# Patient Record
Sex: Male | Born: 1950 | Hispanic: No | Marital: Married | Smoking: Never smoker
Health system: Southern US, Community
[De-identification: ages and names within clinical notes are randomized; demographics above are authoritative.]

## PROBLEM LIST (undated history)

## (undated) DIAGNOSIS — I10 Essential (primary) hypertension: Secondary | ICD-10-CM

## (undated) DIAGNOSIS — M1990 Unspecified osteoarthritis, unspecified site: Secondary | ICD-10-CM

## (undated) DIAGNOSIS — I619 Nontraumatic intracerebral hemorrhage, unspecified: Secondary | ICD-10-CM

## (undated) DIAGNOSIS — F32A Depression, unspecified: Secondary | ICD-10-CM

## (undated) DIAGNOSIS — T7840XA Allergy, unspecified, initial encounter: Secondary | ICD-10-CM

## (undated) DIAGNOSIS — E785 Hyperlipidemia, unspecified: Secondary | ICD-10-CM

## (undated) DIAGNOSIS — A159 Respiratory tuberculosis unspecified: Secondary | ICD-10-CM

## (undated) DIAGNOSIS — M109 Gout, unspecified: Secondary | ICD-10-CM

## (undated) DIAGNOSIS — N529 Male erectile dysfunction, unspecified: Secondary | ICD-10-CM

## (undated) DIAGNOSIS — F329 Major depressive disorder, single episode, unspecified: Secondary | ICD-10-CM

## (undated) DIAGNOSIS — I639 Cerebral infarction, unspecified: Secondary | ICD-10-CM

## (undated) HISTORY — PX: COLONOSCOPY: SHX174

## (undated) HISTORY — DX: Cerebral infarction, unspecified: I63.9

## (undated) HISTORY — PX: SMALL INTESTINE SURGERY: SHX150

## (undated) HISTORY — DX: Major depressive disorder, single episode, unspecified: F32.9

## (undated) HISTORY — DX: Essential (primary) hypertension: I10

## (undated) HISTORY — DX: Unspecified osteoarthritis, unspecified site: M19.90

## (undated) HISTORY — DX: Respiratory tuberculosis unspecified: A15.9

## (undated) HISTORY — DX: Depression, unspecified: F32.A

## (undated) HISTORY — DX: Allergy, unspecified, initial encounter: T78.40XA

---

## 1999-04-12 ENCOUNTER — Encounter: Admission: RE | Admit: 1999-04-12 | Discharge: 1999-07-11 | Payer: Self-pay | Admitting: Family Medicine

## 2001-12-23 ENCOUNTER — Encounter (INDEPENDENT_AMBULATORY_CARE_PROVIDER_SITE_OTHER): Payer: Self-pay | Admitting: *Deleted

## 2001-12-23 ENCOUNTER — Ambulatory Visit (HOSPITAL_COMMUNITY): Admission: RE | Admit: 2001-12-23 | Discharge: 2001-12-23 | Payer: Self-pay | Admitting: *Deleted

## 2009-04-25 ENCOUNTER — Emergency Department (HOSPITAL_COMMUNITY): Admission: EM | Admit: 2009-04-25 | Discharge: 2009-04-25 | Payer: Self-pay | Admitting: Family Medicine

## 2010-06-15 DIAGNOSIS — I619 Nontraumatic intracerebral hemorrhage, unspecified: Secondary | ICD-10-CM

## 2010-06-15 HISTORY — DX: Nontraumatic intracerebral hemorrhage, unspecified: I61.9

## 2010-06-16 ENCOUNTER — Inpatient Hospital Stay (HOSPITAL_COMMUNITY): Admission: EM | Admit: 2010-06-16 | Discharge: 2010-06-17 | Payer: Self-pay | Admitting: Emergency Medicine

## 2010-06-18 ENCOUNTER — Emergency Department (HOSPITAL_COMMUNITY): Admission: EM | Admit: 2010-06-18 | Discharge: 2010-06-18 | Payer: Self-pay | Admitting: Emergency Medicine

## 2010-11-28 ENCOUNTER — Emergency Department (HOSPITAL_COMMUNITY)
Admission: EM | Admit: 2010-11-28 | Discharge: 2010-11-29 | Payer: Self-pay | Source: Home / Self Care | Admitting: Emergency Medicine

## 2011-03-03 LAB — CBC
HCT: 43.2 % (ref 39.0–52.0)
HCT: 45.8 % (ref 39.0–52.0)
Hemoglobin: 14.8 g/dL (ref 13.0–17.0)
MCH: 30.3 pg (ref 26.0–34.0)
MCHC: 34.3 g/dL (ref 30.0–36.0)
MCHC: 34.3 g/dL (ref 30.0–36.0)
MCHC: 35.3 g/dL (ref 30.0–36.0)
MCV: 87.4 fL (ref 78.0–100.0)
MCV: 88.5 fL (ref 78.0–100.0)
Platelets: 194 10*3/uL (ref 150–400)
Platelets: 204 10*3/uL (ref 150–400)
RBC: 4.88 MIL/uL (ref 4.22–5.81)
RDW: 13.6 % (ref 11.5–15.5)
RDW: 13.6 % (ref 11.5–15.5)
WBC: 7.9 10*3/uL (ref 4.0–10.5)

## 2011-03-03 LAB — DIFFERENTIAL
Basophils Absolute: 0 10*3/uL (ref 0.0–0.1)
Basophils Relative: 0 % (ref 0–1)
Basophils Relative: 1 % (ref 0–1)
Eosinophils Absolute: 0.2 10*3/uL (ref 0.0–0.7)
Eosinophils Absolute: 0.3 10*3/uL (ref 0.0–0.7)
Lymphocytes Relative: 23 % (ref 12–46)
Lymphocytes Relative: 27 % (ref 12–46)
Lymphs Abs: 1.8 10*3/uL (ref 0.7–4.0)
Lymphs Abs: 2.2 10*3/uL (ref 0.7–4.0)
Monocytes Absolute: 0.4 10*3/uL (ref 0.1–1.0)
Neutro Abs: 5 10*3/uL (ref 1.7–7.7)
Neutrophils Relative %: 63 % (ref 43–77)

## 2011-03-03 LAB — LIPID PANEL
Cholesterol: 226 mg/dL — ABNORMAL HIGH (ref 0–200)
HDL: 50 mg/dL (ref >39)
LDL Cholesterol: 107 mg/dL — ABNORMAL HIGH (ref 0–99)
Total CHOL/HDL Ratio: 4.5 RATIO
Triglycerides: 346 mg/dL — ABNORMAL HIGH (ref <150)
VLDL: 69 mg/dL — ABNORMAL HIGH (ref 0–40)

## 2011-03-03 LAB — BASIC METABOLIC PANEL
CO2: 28 mEq/L (ref 19–32)
Calcium: 9.5 mg/dL (ref 8.4–10.5)
Creatinine, Ser: 0.82 mg/dL (ref 0.4–1.5)
GFR calc Af Amer: >60 mL/min (ref >60)
GFR calc Af Amer: >60 mL/min (ref >60)
Glucose, Bld: 112 mg/dL — ABNORMAL HIGH (ref 70–99)
Glucose, Bld: 118 mg/dL — ABNORMAL HIGH (ref 70–99)
Potassium: 4.2 mEq/L (ref 3.5–5.1)
Sodium: 140 mEq/L (ref 135–145)

## 2011-03-03 LAB — CARDIAC PANEL(CRET KIN+CKTOT+MB+TROPI)
CK, MB: 11.2 ng/mL (ref 0.3–4.0)
Relative Index: 1.7 (ref 0.0–2.5)
Total CK: 653 U/L — ABNORMAL HIGH (ref 7–232)
Troponin I: 0.03 ng/mL (ref 0.00–0.06)

## 2011-03-03 LAB — COMPREHENSIVE METABOLIC PANEL
ALT: 38 U/L (ref 0–53)
AST: 42 U/L — ABNORMAL HIGH (ref 0–37)
Albumin: 4.2 g/dL (ref 3.5–5.2)
Alkaline Phosphatase: 46 U/L (ref 39–117)
BUN: 13 mg/dL (ref 6–23)
CO2: 29 mEq/L (ref 19–32)
Calcium: 9.7 mg/dL (ref 8.4–10.5)
Chloride: 104 mEq/L (ref 96–112)
Creatinine, Ser: 0.83 mg/dL (ref 0.4–1.5)
GFR calc Af Amer: >60 mL/min (ref >60)
GFR calc non Af Amer: >60 mL/min (ref >60)
Glucose, Bld: 104 mg/dL — ABNORMAL HIGH (ref 70–99)
Potassium: 3.4 mEq/L — ABNORMAL LOW (ref 3.5–5.1)
Sodium: 140 mEq/L (ref 135–145)
Total Bilirubin: 0.7 mg/dL (ref 0.3–1.2)
Total Protein: 7.6 g/dL (ref 6.0–8.3)

## 2011-03-03 LAB — PROTIME-INR
INR: 0.91 (ref 0.00–1.49)
Prothrombin Time: 12.2 seconds (ref 11.6–15.2)

## 2011-03-03 LAB — POCT CARDIAC MARKERS
CKMB, poc: 3.6 ng/mL (ref 1.0–8.0)
Myoglobin, poc: 128 ng/mL (ref 12–200)
Myoglobin, poc: 133 ng/mL (ref 12–200)

## 2011-03-03 LAB — TSH: TSH: 1.603 u[IU]/mL (ref 0.350–4.500)

## 2011-03-03 LAB — CK TOTAL AND CKMB (NOT AT ARMC)
CK, MB: 14.6 ng/mL (ref 0.3–4.0)
Total CK: 835 U/L — ABNORMAL HIGH (ref 7–232)

## 2011-03-03 LAB — HEMOGLOBIN A1C
Hgb A1c MFr Bld: 5.8 % — ABNORMAL HIGH (ref <5.7)
Mean Plasma Glucose: 120 mg/dL — ABNORMAL HIGH (ref <117)

## 2011-05-03 NOTE — Procedures (Signed)
[place]  Patient:    Joseph [name], Joseph [name] Visit Number: [id number] MRN: [id number]          Service Type: END Location: ENDO Attending Physician:  Sabino Gasser Dictated by:   Sabino Gasser, M.D. Proc. Date: 12/23/01 Admit Date:  12/23/2001   CC:         Robert Bellow, M.D.   Procedure Report  PROCEDURE PERFORMED:  Colonoscopy with biopsy.  ENDOSCOPIST:  Sabino Gasser, M.D.  INDICATIONS FOR PROCEDURE:  Polyp.  Heme positivity.  ANESTHESIA:  Demerol 100 mg.  Versed 10 mg.  DESCRIPTION OF PROCEDURE:  With the patient mildly sedated in the left lateral decubitus position and subsequently rolled to his back and then to the right lateral decubitus position, a rectal examination was performed.  The Olympus videoscopic colonoscope was inserted in the rectum and passed through a very tortuous difficult colon all the way to the cecum.  Pressure had to be applied to the abdomen in multiple places and it was a very long tortuous colon.  We reached the cecum, the prep was good.  The cecum was identified by the ileocecal valve and appendiceal orifice, both of which were photographed. From this point, the colonoscope was slowly withdrawn, taking circumferential views of the entire colonic mucosa stopping only approximately 25 cm form the anal verge at which point two small polyps were seen.  One was photographed and both were removed using hot biopsy forceps technique, setting of 20/20 blended current.  We then withdrew the colonoscope to the rectum, which appeared normal on direct and retroflex view.  The endoscope was straightened and withdrawn.  Patients vital signs and pulse oximeter remained stable.  The patient tolerated the procedure well and without apparent complications.  FINDINGS:  Colon polyps, otherwise unremarkable.  PLAN:  Await biopsy report.  The patient will call me for results and follow up with me as an outpatient. Dictated by:   Sabino Gasser, M.D. Attending Physician:  Sabino Gasser DD:  12/23/01 TD:  12/23/01 Job: [id number]

## 2011-11-22 ENCOUNTER — Ambulatory Visit: Payer: Self-pay

## 2011-11-22 DIAGNOSIS — Z23 Encounter for immunization: Secondary | ICD-10-CM

## 2011-11-22 DIAGNOSIS — I10 Essential (primary) hypertension: Secondary | ICD-10-CM

## 2011-11-22 DIAGNOSIS — M25579 Pain in unspecified ankle and joints of unspecified foot: Secondary | ICD-10-CM

## 2011-11-22 DIAGNOSIS — Z79899 Other long term (current) drug therapy: Secondary | ICD-10-CM

## 2012-05-22 ENCOUNTER — Other Ambulatory Visit: Payer: Self-pay | Admitting: Physician Assistant

## 2012-06-04 ENCOUNTER — Other Ambulatory Visit: Payer: Self-pay | Admitting: Physician Assistant

## 2012-06-04 NOTE — Telephone Encounter (Signed)
Need chart

## 2012-06-15 ENCOUNTER — Ambulatory Visit: Payer: Self-pay | Admitting: Internal Medicine

## 2012-06-15 VITALS — BP 131/79 | HR 62 | Temp 98.5°F | Resp 16 | Ht 68.5 in | Wt 197.6 lb

## 2012-06-15 DIAGNOSIS — I10 Essential (primary) hypertension: Secondary | ICD-10-CM

## 2012-06-15 DIAGNOSIS — N529 Male erectile dysfunction, unspecified: Secondary | ICD-10-CM

## 2012-06-15 MED ORDER — LISINOPRIL 40 MG PO TABS: 40.0000 mg | ORAL_TABLET | Freq: Every day | ORAL | Status: DC

## 2012-06-15 MED ORDER — HYDROCHLOROTHIAZIDE 25 MG PO TABS: 25.0000 mg | ORAL_TABLET | Freq: Every day | ORAL | Status: DC

## 2012-06-15 MED ORDER — SILDENAFIL CITRATE 100 MG PO TABS: 50.0000 mg | ORAL_TABLET | Freq: Every day | ORAL | Status: DC | PRN

## 2012-06-15 NOTE — Patient Instructions (Signed)
STOP THE AMLODIPINE AND STOP HTE LISINOPRIL. RESTART THE NEW RX OF LISINOPRIL AND HCTS. USE THE VIAGRA AS DIRECTED.

## 2012-06-15 NOTE — Progress Notes (Signed)
  Subjective:    Patient ID: Joseph [name], male    DOB: 03/26/1951, 61 y.o.   MRN: [id number]  HPI HERE FOR REFILL O F HIS BP MEDICATION ALSO WOULD LIKE TO BE EVAL FOR  IMPOTENCE.  CANT AFFORD THE AMLODIPINE ANY MORE BUT HAD BEEN TAKING IT. HE USED TO BE ON THE LISINOPRIL 40 MG AND REQUEST THAT BE CHANGED SO HE DOES NOT NEED TO BUT 2 MEDS FOR HIS BP. ALSO WOULD LIKE TO TRY VIAGRA FOR IMPOTENCE.    Review of Systems  Constitutional: Negative.   HENT: Negative.   Eyes: Negative.   Respiratory: Negative.   Cardiovascular: Negative.   Gastrointestinal: Negative.   Genitourinary: Negative.   Skin: Negative.   Neurological: Negative.   Hematological: Negative.   Psychiatric/Behavioral: Negative.   All other systems reviewed and are negative.       Objective:   Physical Exam  Constitutional: He is oriented to person, place, and time. He appears well-developed and well-nourished.  HENT:  Head: Normocephalic and atraumatic.  Right Ear: External ear normal.  Left Ear: External ear normal.  Nose: Nose normal.  Mouth/Throat: Oropharynx is clear and moist.  Eyes: Conjunctivae are normal.  Neck: Normal range of motion. Neck supple.  Cardiovascular: Normal rate, regular rhythm and normal heart sounds.   Pulmonary/Chest: Effort normal.  Abdominal: Soft. Bowel sounds are normal.  Musculoskeletal: Normal range of motion.  Neurological: He is alert and oriented to person, place, and time. He has normal reflexes.  Skin: Skin is warm and dry.  Psychiatric: He has a normal mood and affect. His behavior is normal. Judgment normal.          Assessment & Plan:  HTN WELL CONTROLLED WILL DC AMLODIPINE AT PTS REQUEST AND INCREASE THE DOSE OF THE LISINOPRIL IMPOTENCE. EXPLAINED MULTFACTORIAL REASONS FOR IMPOTENCE PT WANT S TOTRY VIAGRA WILL RX L

## 2012-10-16 ENCOUNTER — Other Ambulatory Visit: Payer: Self-pay | Admitting: Internal Medicine

## 2012-10-26 ENCOUNTER — Ambulatory Visit: Payer: Self-pay | Admitting: Family Medicine

## 2012-10-26 VITALS — BP 152/84 | HR 93 | Temp 98.4°F | Resp 16 | Ht 68.5 in | Wt 199.8 lb

## 2012-10-26 DIAGNOSIS — E785 Hyperlipidemia, unspecified: Secondary | ICD-10-CM | POA: Insufficient documentation

## 2012-10-26 DIAGNOSIS — I10 Essential (primary) hypertension: Secondary | ICD-10-CM

## 2012-10-26 DIAGNOSIS — M109 Gout, unspecified: Secondary | ICD-10-CM | POA: Insufficient documentation

## 2012-10-26 DIAGNOSIS — B351 Tinea unguium: Secondary | ICD-10-CM

## 2012-10-26 DIAGNOSIS — Z79899 Other long term (current) drug therapy: Secondary | ICD-10-CM

## 2012-10-26 LAB — COMPREHENSIVE METABOLIC PANEL
Albumin: 4.6 g/dL (ref 3.5–5.2)
Alkaline Phosphatase: 56 U/L (ref 39–117)
BUN: 23 mg/dL (ref 6–23)
CO2: 28 mEq/L (ref 19–32)
Glucose, Bld: 95 mg/dL (ref 70–99)
Potassium: 4.1 mEq/L (ref 3.5–5.3)
Sodium: 132 mEq/L — ABNORMAL LOW (ref 135–145)
Total Bilirubin: 0.4 mg/dL (ref 0.3–1.2)
Total Protein: 8.4 g/dL — ABNORMAL HIGH (ref 6.0–8.3)

## 2012-10-26 MED ORDER — TERBINAFINE HCL 250 MG PO TABS: 250.0000 mg | ORAL_TABLET | Freq: Every day | ORAL | Status: DC

## 2012-10-26 MED ORDER — PRAVASTATIN SODIUM 40 MG PO TABS: 40.0000 mg | ORAL_TABLET | Freq: Every day | ORAL | Status: DC

## 2012-10-26 MED ORDER — ATENOLOL 100 MG PO TABS: 100.0000 mg | ORAL_TABLET | Freq: Every day | ORAL | Status: DC

## 2012-10-26 MED ORDER — LISINOPRIL 40 MG PO TABS: 40.0000 mg | ORAL_TABLET | Freq: Every day | ORAL | Status: DC

## 2012-10-26 MED ORDER — PREDNISONE 10 MG PO TABS: ORAL_TABLET | ORAL | Status: DC

## 2012-10-26 NOTE — Patient Instructions (Addendum)
Gout Gout is an inflammatory condition (arthritis) caused by a buildup of uric acid crystals in the joints. Uric acid is a chemical that is normally present in the blood. Under some circumstances, uric acid can form into crystals in your joints. This causes joint redness, soreness, and swelling (inflammation). Repeat attacks are common. Over time, uric acid crystals can form into masses (tophi) near a joint, causing disfigurement. Gout is treatable and often preventable. CAUSES  The disease begins with elevated levels of uric acid in the blood. Uric acid is produced by your body when it breaks down a naturally found substance called purines. This also happens when you eat certain foods such as meats and fish. Causes of an elevated uric acid level include:  Being passed down from parent to child (heredity).  Diseases that cause increased uric acid production (obesity, psoriasis, some cancers).  Excessive alcohol use.  Diet, especially diets rich in meat and seafood.  Medicines, including certain cancer-fighting drugs (chemotherapy), diuretics, and aspirin.  Chronic kidney disease. The kidneys are no longer able to remove uric acid well.  Problems with metabolism. Conditions strongly associated with gout include:  Obesity.  High blood pressure.  High cholesterol.  Diabetes. Not everyone with elevated uric acid levels gets gout. It is not understood why some people get gout and others do not. Surgery, joint injury, and eating too much of certain foods are some of the factors that can lead to gout. SYMPTOMS   An attack of gout comes on quickly. It causes intense pain with redness, swelling, and warmth in a joint.  Fever can occur.  Often, only one joint is involved. Certain joints are more commonly involved:  Base of the big toe.  Knee.  Ankle.  Wrist.  Finger. Without treatment, an attack usually goes away in a few days to weeks. Between attacks, you usually will not have  symptoms, which is different from many other forms of arthritis. DIAGNOSIS  Your caregiver will suspect gout based on your symptoms and exam. Removal of fluid from the joint (arthrocentesis) is done to check for uric acid crystals. Your caregiver will give you a medicine that numbs the area (local anesthetic) and use a needle to remove joint fluid for exam. Gout is confirmed when uric acid crystals are seen in joint fluid, using a special microscope. Sometimes, blood, urine, and X-ray tests are also used. TREATMENT  There are 2 phases to gout treatment: treating the sudden onset (acute) attack and preventing attacks (prophylaxis). Treatment of an Acute Attack  Medicines are used. These include anti-inflammatory medicines or steroid medicines.  An injection of steroid medicine into the affected joint is sometimes necessary.  The painful joint is rested. Movement can worsen the arthritis.  You may use warm or cold treatments on painful joints, depending which works best for you.  Discuss the use of coffee, vitamin C, or cherries with your caregiver. These may be helpful treatment options. Treatment to Prevent Attacks After the acute attack subsides, your caregiver may advise prophylactic medicine. These medicines either help your kidneys eliminate uric acid from your body or decrease your uric acid production. You may need to stay on these medicines for a very long time. The early phase of treatment with prophylactic medicine can be associated with an increase in acute gout attacks. For this reason, during the first few months of treatment, your caregiver may also advise you to take medicines usually used for acute gout treatment. Be sure you understand your caregiver's directions.   You should also discuss dietary treatment with your caregiver. Certain foods such as meats and fish can increase uric acid levels. Other foods such as dairy can decrease levels. Your caregiver can give you a list of foods  to avoid. HOME CARE INSTRUCTIONS   Do not take aspirin to relieve pain. This raises uric acid levels.  Only take over-the-counter or prescription medicines for pain, discomfort, or fever as directed by your caregiver.  Rest the joint as much as possible. When in bed, keep sheets and blankets off painful areas.  Keep the affected joint raised (elevated).  Use crutches if the painful joint is in your leg.  Drink enough water and fluids to keep your urine clear or pale yellow. This helps your body get rid of uric acid. Do not drink alcoholic beverages. They slow the passage of uric acid.  Follow your caregiver's dietary instructions. Pay careful attention to the amount of protein you eat. Your daily diet should emphasize fruits, vegetables, whole grains, and fat-free or low-fat milk products.  Maintain a healthy body weight. SEEK MEDICAL CARE IF:   You have an oral temperature above 102 F (38.9 C).  You develop diarrhea, vomiting, or any side effects from medicines.  You do not feel better in 24 hours, or you are getting worse. SEEK IMMEDIATE MEDICAL CARE IF:   Your joint becomes suddenly more tender and you have:  Chills.  An oral temperature above 102 F (38.9 C), not controlled by medicine. MAKE SURE YOU:   Understand these instructions.  Will watch your condition.  Will get help right away if you are not doing well or get worse. Document Released: 11/29/2000 Document Revised: 02/24/2012 Document Reviewed: 03/12/2010 ExitCare Patient Information 2013 [employer] DASH Diet The DASH diet stands for "Dietary Approaches to Stop Hypertension." It is a healthy eating plan that has been shown to reduce high blood pressure (hypertension) in as little as 14 days, while also possibly providing other significant health benefits. These other health benefits include reducing the risk of breast cancer after menopause and reducing the risk of type 2 diabetes, heart disease, colon  cancer, and stroke. Health benefits also include weight loss and slowing kidney failure in patients with chronic kidney disease.  DIET GUIDELINES  Limit salt (sodium). Your diet should contain less than 1500 mg of sodium daily.  Limit refined or processed carbohydrates. Your diet should include mostly whole grains. Desserts and added sugars should be used sparingly.  Include small amounts of heart-healthy fats. These types of fats include nuts, oils, and tub margarine. Limit saturated and trans fats. These fats have been shown to be harmful in the body. CHOOSING FOODS  The following food groups are based on a 2000 calorie diet. See your Registered Dietitian for individual calorie needs. Grains and Grain Products (6 to 8 servings daily)  Eat More Often: Whole-wheat bread, brown rice, whole-grain or wheat pasta, quinoa, popcorn without added fat or salt (air popped).  Eat Less Often: White bread, white pasta, white rice, cornbread. Vegetables (4 to 5 servings daily)  Eat More Often: Fresh, frozen, and canned vegetables. Vegetables may be raw, steamed, roasted, or grilled with a minimal amount of fat.  Eat Less Often/Avoid: Creamed or fried vegetables. Vegetables in a cheese sauce. Fruit (4 to 5 servings daily)  Eat More Often: All fresh, canned (in natural juice), or frozen fruits. Dried fruits without added sugar. One hundred percent fruit juice ( cup [237 mL] daily).  Eat Less Often: Dried   fruits with added sugar. Canned fruit in light or heavy syrup. Lean Meats, Fish, and Poultry (2 servings or less daily. One serving is 3 to 4 oz [85-114 g]).  Eat More Often: Ninety percent or leaner ground beef, tenderloin, sirloin. Round cuts of beef, chicken breast, turkey breast. All fish. Grill, bake, or broil your meat. Nothing should be fried.  Eat Less Often/Avoid: Fatty cuts of meat, turkey, or chicken leg, thigh, or wing. Fried cuts of meat or fish. Dairy (2 to 3 servings)  Eat More  Often: Low-fat or fat-free milk, low-fat plain or light yogurt, reduced-fat or part-skim cheese.  Eat Less Often/Avoid: Milk (whole, 2%).Whole milk yogurt. Full-fat cheeses. Nuts, Seeds, and Legumes (4 to 5 servings per week)  Eat More Often: All without added salt.  Eat Less Often/Avoid: Salted nuts and seeds, canned beans with added salt. Fats and Sweets (limited)  Eat More Often: Vegetable oils, tub margarines without trans fats, sugar-free gelatin. Mayonnaise and salad dressings.  Eat Less Often/Avoid: Coconut oils, palm oils, butter, stick margarine, cream, half and half, cookies, candy, pie. FOR MORE INFORMATION The Dash Diet Eating Plan: www.dashdiet.org Document Released: 11/21/2011 Document Revised: 02/24/2012 Document Reviewed: 11/21/2011 Exit[employer] Patient Information 2013 [employer].  

## 2012-10-29 NOTE — Progress Notes (Signed)
Subjective:    Patient ID: Joseph [name], male    DOB: [birth year], 61 y.o.   MRN: [id number] Chief Complaint  Patient presents with  . Arthritis    gout in right foot-needs work note for 3 days  . Hypertension   HPI  Had gout flair in left foot - ankle and 1st MCP both so has not been able to work for hte past 3d.  Has a rx for colchicine but to expensive so has not tried it. Did take bp med lisinopril this morning. Pt is not fasting. Past Medical History  Diagnosis Date  . Stroke   . Hypertension   . Arthritis    Current Outpatient Prescriptions on File Prior to Visit  Medication Sig Dispense Refill  . colchicine 0.6 MG tablet Take 0.6 mg by mouth 3 (three) times daily. Takes prn for gout.      Marland Kitchen lisinopril (PRINIVIL,ZESTRIL) 40 MG tablet Take 1 tablet (40 mg total) by mouth daily. Need office visit for additional refills.  30 tablet  5  . pravastatin (PRAVACHOL) 40 MG tablet Take 1 tablet (40 mg total) by mouth at bedtime.  30 tablet  5  . atenolol (TENORMIN) 100 MG tablet Take 1 tablet (100 mg total) by mouth daily.  90 tablet  3  . sildenafil (VIAGRA) 100 MG tablet Take 0.5-1 tablets (50-100 mg total) by mouth daily as needed for erectile dysfunction.  5 tablet  11     Review of Systems  Constitutional: Positive for activity change. Negative for fever, chills and diaphoresis.  Respiratory: Negative for shortness of breath.   Cardiovascular: Positive for leg swelling. Negative for chest pain.  Musculoskeletal: Positive for joint swelling, arthralgias and gait problem. Negative for myalgias.  Skin: Positive for color change. Negative for pallor and wound.  Hematological: Negative for adenopathy. Does not bruise/bleed easily.  Psychiatric/Behavioral: Positive for sleep disturbance.      BP 152/84  Pulse 93  Temp 98.4 F (36.9 C) (Oral)  Resp 16  Ht 5' 8.5" (1.74 m)  Wt 199 lb 12.8 oz (90.629 kg)  BMI 29.94 kg/m2  SpO2 99% Objective:   Physical Exam  Constitutional:  He is oriented to person, place, and time. He appears well-developed and well-nourished. No distress.  HENT:  Head: Normocephalic and atraumatic.  Eyes: Conjunctivae normal are normal. No scleral icterus.  Neck: Neck supple. No thyromegaly present.  Cardiovascular: Normal rate, regular rhythm, normal heart sounds and intact distal pulses.   Pulmonary/Chest: Effort normal and breath sounds normal. No respiratory distress.  Musculoskeletal: He exhibits tenderness.       Left foot: He exhibits decreased range of motion, tenderness, bony tenderness and swelling.       Bilateral feet with scaling, enlarged 1st MTP joint - tophi vs bunion - left > right.  Left lateral malleolus with warmth and severe tenderness. Antalgic gait.  Lymphadenopathy:    He has no cervical adenopathy.  Neurological: He is alert and oriented to person, place, and time.  Skin: Skin is warm. He is not diaphoretic.       Bilateral toenails very thick, cracked, yellow  Psychiatric: He has a normal mood and affect. His behavior is normal.          Results for orders placed in visit on 10/26/12  COMPREHENSIVE METABOLIC PANEL      Component Value Range   Sodium 132 (*) 135 - 145 mEq/L   Potassium 4.1  3.5 - 5.3 mEq/L   Chloride  98  96 - 112 mEq/L   CO2 28  19 - 32 mEq/L   Glucose, Bld 95  70 - 99 mg/dL   BUN 23  6 - 23 mg/dL   Creat 9.60  4.54 - 0.98 mg/dL   Total Bilirubin 0.4  0.3 - 1.2 mg/dL   Alkaline Phosphatase 56  39 - 117 U/L   AST 27  0 - 37 U/L   ALT 39  0 - 53 U/L   Total Protein 8.4 (*) 6.0 - 8.3 g/dL   Albumin 4.6  3.5 - 5.2 g/dL   Calcium 9.7  8.4 - 11.9 mg/dL    Assessment & Plan:   1. Encounter for long-term (current) use of other medications  Comprehensive metabolic panel  2. Hypertension - reviewed low sodium diet.  Cont lisinopril, stop hctz since can induce gout flair, try atenolol (pt needs meds on $4list). lisinopril (PRINIVIL,ZESTRIL) 40 MG tablet, atenolol (TENORMIN) 100 MG tablet  3.  Hyperlipidemia - needs flp at f/u pravastatin (PRAVACHOL) 40 MG tablet  4. Gout  - reviewed start prednisone as soon as gout flair starts to prevent. Reviewed low uric acid diet.  Cons checking uric acid and allopurinol if flairs increase predniSONE (DELTASONE) 10 MG tablet  5. Onychomycosis of toenail - start lamisil - recheck lfts at f/u terbinafine (LAMISIL) 250 MG tablet

## 2013-06-22 ENCOUNTER — Ambulatory Visit: Payer: Self-pay | Admitting: Family Medicine

## 2013-06-22 ENCOUNTER — Other Ambulatory Visit: Payer: Self-pay | Admitting: Internal Medicine

## 2013-06-22 VITALS — BP 150/92 | HR 82 | Temp 98.0°F | Resp 18 | Ht 70.0 in | Wt 194.4 lb

## 2013-06-22 DIAGNOSIS — N529 Male erectile dysfunction, unspecified: Secondary | ICD-10-CM

## 2013-06-22 DIAGNOSIS — R51 Headache: Secondary | ICD-10-CM

## 2013-06-22 DIAGNOSIS — M109 Gout, unspecified: Secondary | ICD-10-CM

## 2013-06-22 DIAGNOSIS — Z8673 Personal history of transient ischemic attack (TIA), and cerebral infarction without residual deficits: Secondary | ICD-10-CM

## 2013-06-22 DIAGNOSIS — E785 Hyperlipidemia, unspecified: Secondary | ICD-10-CM

## 2013-06-22 DIAGNOSIS — I10 Essential (primary) hypertension: Secondary | ICD-10-CM

## 2013-06-22 LAB — GLUCOSE, POCT (MANUAL RESULT ENTRY): POC Glucose: 94 mg/dl (ref 70–99)

## 2013-06-22 MED ORDER — TRAMADOL HCL 50 MG PO TABS: 50.0000 mg | ORAL_TABLET | Freq: Three times a day (TID) | ORAL | Status: DC | PRN

## 2013-06-22 MED ORDER — LISINOPRIL 40 MG PO TABS: 40.0000 mg | ORAL_TABLET | Freq: Every day | ORAL | Status: DC

## 2013-06-22 MED ORDER — PRAVASTATIN SODIUM 40 MG PO TABS: 40.0000 mg | ORAL_TABLET | Freq: Every day | ORAL | Status: DC

## 2013-06-22 MED ORDER — COLCHICINE 0.6 MG PO TABS: 0.6000 mg | ORAL_TABLET | Freq: Two times a day (BID) | ORAL | Status: DC

## 2013-06-22 MED ORDER — SILDENAFIL CITRATE 100 MG PO TABS: 100.0000 mg | ORAL_TABLET | ORAL | Status: DC | PRN

## 2013-06-22 NOTE — Patient Instructions (Addendum)
Take tramadol one every 6 or 8 hours as needed for headache. You can take Tylenol or ibuprofen in addition to this.  Continue to monitor your blood pressure. If he keeps running high please return. We would like to see it consistently below 140/90.  In the event of worsening headache or any neurologic symptoms as we discussed, go immediately to the emergency room or call 911.

## 2013-06-22 NOTE — Progress Notes (Signed)
Subjective: 62 year old man who had a stroke 3 years ago with a small bleed in his head and mild left weakness which resolved. He was at work today, on break, and developed a right parietal headache. This has been a 4 or 5/10 severity. Did not take any medication for it. He did take his regular blood pressure medication this morning. He of work early and went home and rested. The headache persisted so he came in here because he has a phobia of getting a stroke. He does take his blood pressure medicine faithfully.  Objective: Somewhat anxious man in no major distress. Eyes PERRLA. Fundi benign. Discs flat. EOMs intact. TMs normal. Throat clear. Cranial nerves grossly normal. Chest clear to auscultation. Heart regular without murmurs gallops or arrhythmias. Finger to nose normal. Motor symmetrical. Gait normal. Heel to toe walk normal. Romberg negative. Deep tendon reflexes symmetrical 1+.  Assessment: Right-sided headache Hypertension Hyperlipidemia Anxiety  Plan: Reassurance. I do not see anything neurologic. Go to ER if any symptoms change. Will give headache pain medication

## 2013-06-23 LAB — URIC ACID: Uric Acid, Serum: 8.9 mg/dL — ABNORMAL HIGH (ref 4.0–7.8)

## 2013-06-24 ENCOUNTER — Encounter: Payer: Self-pay | Admitting: Family Medicine

## 2013-08-04 ENCOUNTER — Telehealth: Payer: Self-pay

## 2013-08-04 NOTE — Telephone Encounter (Signed)
Spoke with pharmacist and she states pt is requesting a Rx on Allopurinol. Please advise

## 2013-08-05 NOTE — Telephone Encounter (Signed)
Called pharmacy and advised them that Allopurinol is denied and pt needs office visit for additional refills.

## 2013-08-05 NOTE — Telephone Encounter (Signed)
We have not written for this since at least epic go live. That means he has not taken it since at least 01/13/12. Needs office visit.

## 2014-03-14 ENCOUNTER — Other Ambulatory Visit: Payer: Self-pay | Admitting: Family Medicine

## 2014-03-25 ENCOUNTER — Ambulatory Visit (INDEPENDENT_AMBULATORY_CARE_PROVIDER_SITE_OTHER): Payer: BC Managed Care – PPO | Admitting: Family Medicine

## 2014-03-25 VITALS — BP 150/88 | HR 79 | Temp 98.6°F | Resp 16 | Ht 68.0 in | Wt 185.0 lb

## 2014-03-25 DIAGNOSIS — M109 Gout, unspecified: Secondary | ICD-10-CM

## 2014-03-25 DIAGNOSIS — M79676 Pain in unspecified toe(s): Secondary | ICD-10-CM

## 2014-03-25 DIAGNOSIS — I10 Essential (primary) hypertension: Secondary | ICD-10-CM

## 2014-03-25 DIAGNOSIS — M79609 Pain in unspecified limb: Secondary | ICD-10-CM

## 2014-03-25 LAB — POCT CBC
Granulocyte percent: 71.5 %G (ref 37–80)
HCT, POC: 45.4 % (ref 43.5–53.7)
HEMOGLOBIN: 14.5 g/dL (ref 14.1–18.1)
Lymph, poc: 1.9 (ref 0.6–3.4)
MCH: 29.8 pg (ref 27–31.2)
MCHC: 31.9 g/dL (ref 31.8–35.4)
MCV: 93.4 fL (ref 80–97)
MID (CBC): 0.7 (ref 0–0.9)
MPV: 10.7 fL (ref 0–99.8)
PLATELET COUNT, POC: 186 10*3/uL (ref 142–424)
POC GRANULOCYTE: 6.3 (ref 2–6.9)
POC LYMPH PERCENT: 21.1 %L (ref 10–50)
POC MID %: 7.4 % (ref 0–12)
RBC: 4.86 M/uL (ref 4.69–6.13)
RDW, POC: 14.5 %
WBC: 8.8 10*3/uL (ref 4.6–10.2)

## 2014-03-25 MED ORDER — CEPHALEXIN 500 MG PO CAPS: 500.0000 mg | ORAL_CAPSULE | Freq: Three times a day (TID) | ORAL | Status: DC

## 2014-03-25 MED ORDER — AMLODIPINE BESYLATE 5 MG PO TABS: 5.0000 mg | ORAL_TABLET | Freq: Every day | ORAL | Status: DC

## 2014-03-25 MED ORDER — LISINOPRIL 20 MG PO TABS: 40.0000 mg | ORAL_TABLET | Freq: Every day | ORAL | Status: DC

## 2014-03-25 MED ORDER — TRAMADOL HCL 50 MG PO TABS: 50.0000 mg | ORAL_TABLET | Freq: Three times a day (TID) | ORAL | Status: DC | PRN

## 2014-03-25 MED ORDER — COLCHICINE 0.6 MG PO TABS: ORAL_TABLET | ORAL | Status: DC

## 2014-03-25 NOTE — Patient Instructions (Addendum)
You most likely have a gout infection on your right foot.  You have been prescribed Colchicine- please use as directed.   If your foot is not better in 1 day please let us know.    You have also been prescribed an antibiotic in case this infection is caused by a bacteria.  Take the keflex three times a day  You have been given prescription for tramadol.  This is a pain medication, please take 50 mg every 8 hrs as needed for pain.  Please refrain from wearing the shoes that caused this to worsen.  Try to stay off of the foot over the weekend.    You can add the amlodipine 5mg  to your BP regimen  Don't take your pravachol today because you are taking the colchicine today- you can go back on it tomorrow.

## 2014-03-25 NOTE — Progress Notes (Signed)
[facility] [address], [place] [address] [phone] [phone]  Date:  03/25/2014   Name:  Joseph [name]   DOB:  04-16-1951   MRN:  [id number]  PCP:  No primary provider on file.    Chief Complaint: Foot Swelling   History of Present Illness:  Joseph [name] is a 63 y.o. very pleasant male patient who presents with the following: today is Friday.  He has complaint of a swollen right foot that began on Monday after he walked 5 miles on Sunday.  Swelling is worsening.  He denies fever but states it is painful to walk. Denies numbness in foot.  Has not tried any medications to help with this problem.   He reports that 2 weeks ago he had swelling in the same area x 1 day which resolved completely after wearing the same pair of shoes.  He has had gout in the same foot but states that this feels different.  The gout was more painful.   He has cut a hole in his shoe so that he could wear it to work today.  He is also concerned about some headaches that he may notice in the am.  He had been told to stop one of his BP medications (which medication this was is not clear) and is now only on lisinopril.  He was also on norvasc a while ago and would like to add this if possible.    Otherwise he feels well, no fever.  He did have a CVA several years and ago recovered well  Patient Active Problem List   Diagnosis Date Noted  . History of stroke 06/22/2013  . Encounter for long-term (current) use of other medications 10/26/2012  . Hypertension 10/26/2012  . Hyperlipidemia 10/26/2012  . Gout 10/26/2012    Past Medical History  Diagnosis Date  . Stroke   . Hypertension   . Arthritis     History reviewed. No pertinent past surgical history.  History  Substance Use Topics  . Smoking status: Never Smoker   . Smokeless tobacco: Not on file  . Alcohol Use: No    Family History  Problem Relation Age of Onset  . Stroke Father     No Known Allergies  Medication list  has been reviewed and updated.  Current Outpatient Prescriptions on File Prior to Visit  Medication Sig Dispense Refill  . atenolol (TENORMIN) 100 MG tablet Take 1 tablet (100 mg total) by mouth daily.  90 tablet  3  . lisinopril (PRINIVIL,ZESTRIL) 20 MG tablet TAKE TWO TABLETS BY MOUTH ONCE DAILY **PT  NEEDS  OFFICE  VISIT  FOR  ADDITIONAL  REFILLS**  60 tablet  0  . pravastatin (PRAVACHOL) 40 MG tablet Take 1 tablet (40 mg total) by mouth at bedtime.  30 tablet  5  . sildenafil (VIAGRA) 100 MG tablet Take 1 tablet (100 mg total) by mouth as needed for erectile dysfunction.  5 tablet  12  . colchicine 0.6 MG tablet Take 1 tablet (0.6 mg total) by mouth 2 (two) times daily. Takes prn for gout.  20 tablet  1  . predniSONE (DELTASONE) 10 MG tablet 6/5/4/3/2/1 taper  21 tablet  1  . terbinafine (LAMISIL) 250 MG tablet Take 1 tablet (250 mg total) by mouth daily.  30 tablet  5  . traMADol (ULTRAM) 50 MG tablet Take 1 tablet (50 mg total) by mouth every 8 (eight) hours as needed for pain.  30 tablet  0   No current facility-administered medications on file prior to visit.    Review of Systems: Gen: denies fever, chills Cardio- denies palpitations, chest pain Resp - denies sob, wheeze,  Ext - admits pain and swelling in rt foot, denies numbness or loss of sensation GI- denies ab pain  Physical Examination: Filed Vitals:   03/25/14 1848  BP: 150/88  Pulse: 79  Temp: 98.6 F (37 C)  Resp: 16   Filed Vitals:   03/25/14 1848  Height: 5\' 8"  (1.727 m)  Weight: 185 lb (83.915 kg)   Body mass index is 28.14 kg/(m^2). Ideal Body Weight: Weight in (lb) to have BMI = 25: 164.1 GEN: WDWN, NAD, Non-toxic, A & O x 3, looks well HEENT: Atraumatic, Normocephalic. Neck supple. No masses, No LAD. Ears and Nose: No external deformity. CV: RRR, No M/G/R. No JVD. No thrill. No extra heart sounds. PULM: CTA B, no wheezes, crackles, rhonchi. No retractions. No resp. distress. No accessory muscle  use. EXTR: swelling of the right foot with the majority of the swelling located on the medial aspect of the great toe.  Tender with movement of the 1st MTP.  Pt does have bilateral bunions.  Redness and erythema along the top of the foot. Redness does not extend onto the dorsum of the foot.  Normal pulses and foot is well- perfused PSYCH: Normally interactive. Conversant. Not depressed or anxious appearing.  Calm demeanor.   Results for orders placed in visit on 03/25/14  POCT CBC      Result Value Ref Range   WBC 8.8  4.6 - 10.2 K/uL   Lymph, poc 1.9  0.6 - 3.4   POC LYMPH PERCENT 21.1  10 - 50 %L   MID (cbc) 0.7  0 - 0.9   POC MID % 7.4  0 - 12 %M   POC Granulocyte 6.3  2 - 6.9   Granulocyte percent 71.5  37 - 80 %G   RBC 4.86  4.69 - 6.13 M/uL   Hemoglobin 14.5  14.1 - 18.1 g/dL   HCT, POC 40.945.4  81.143.5 - 53.7 %   MCV 93.4  80 - 97 fL   MCH, POC 29.8  27 - 31.2 pg   MCHC 31.9  31.8 - 35.4 g/dL   RDW, POC 91.414.5     Platelet Count, POC 186  142 - 424 K/uL   MPV 10.7  0 - 99.8 fL   Recheck BP virtually unchanged  Assessment and Plan: Pain of great toe - Plan: POCT CBC, Uric Acid, cephALEXin (KEFLEX) 500 MG capsule, traMADol (ULTRAM) 50 MG tablet  Gout - Plan: colchicine 0.6 MG tablet  HTN (hypertension) - Plan: lisinopril (PRINIVIL,ZESTRIL) 20 MG tablet, amLODipine (NORVASC) 5 MG tablet  Gout vs cellulitis due to walking too far in uncomfortable shoes.  More strongly suspect gout.  Treat with tramadol for pain and colchicine. Do not take pravachol today (will take colchicine today- can restart tomorrow). Await uric acid and follow-up closely if not better Add norvasc to his BP regimen  Meds ordered this encounter  Medications  . colchicine 0.6 MG tablet    Sig: Take 2 pills once, then take another an hour later    Dispense:  20 tablet    Refill:  1  . cephALEXin (KEFLEX) 500 MG capsule    Sig: Take 1 capsule (500 mg total) by mouth 3 (three) times daily.    Dispense:  30  capsule    Refill:  0  . traMADol (ULTRAM) 50 MG tablet    Sig: Take 1 tablet (50 mg total) by mouth every 8 (eight) hours as needed.    Dispense:  30 tablet    Refill:  0  . lisinopril (PRINIVIL,ZESTRIL) 20 MG tablet    Sig: Take 2 tablets (40 mg total) by mouth daily.    Dispense:  180 tablet    Refill:  3  . amLODipine (NORVASC) 5 MG tablet    Sig: Take 1 tablet (5 mg total) by mouth daily.    Dispense:  90 tablet    Refill:  3      Signed Abbe Amsterdam, MD

## 2014-03-26 LAB — URIC ACID: URIC ACID, SERUM: 6.7 mg/dL (ref 4.0–7.8)

## 2014-03-27 ENCOUNTER — Telehealth: Payer: Self-pay

## 2014-03-27 ENCOUNTER — Encounter: Payer: Self-pay | Admitting: Family Medicine

## 2014-03-27 NOTE — Telephone Encounter (Signed)
PATIENT CALLED IN STATING HE MISSED A CALL FROM DR COPLAND. PATIENT STATES HE IS HAVING A REACTION TO HIS MEDICATION, ADVISED PATIENT TO RETURN TO THE OFFICE IF HE THINKS HE IS HAVING A REACTION. PATIENT STATES HE JUST WANTS TO TALK WITH DR COPLAND TO HAVE ANOTHER MEDICATION PRESCRIBED. ADV PATIENT THAT DR COPLAND IS NOT HERE TODAY AND SHE SHOULD HAVE LEFT A VOICEMAIL WITH INSTRUCTIONS FOR PATIENT, IF NOT ANOTHER PROVIDER WILL BE MORE THAN HAPPY TO SEE HIM TODAY.   PATIENT ONLY WANTS TO BE CALLED AND REFUSES TO COME BACK IN.

## 2014-03-28 NOTE — Telephone Encounter (Signed)
Called him back- the colchicine seemed to upset his stomach but he is feeling a lot better now.  His foot is much better. Explained that stomach upset is a common SE of the colchicine; he does not need to take it again unless he has another flare up.

## 2014-05-20 ENCOUNTER — Ambulatory Visit (INDEPENDENT_AMBULATORY_CARE_PROVIDER_SITE_OTHER): Payer: BC Managed Care – PPO | Admitting: Family Medicine

## 2014-05-20 ENCOUNTER — Encounter: Payer: Self-pay | Admitting: Family Medicine

## 2014-05-20 VITALS — BP 156/100 | HR 77 | Temp 98.6°F | Resp 16 | Ht 68.5 in | Wt 189.6 lb

## 2014-05-20 DIAGNOSIS — I10 Essential (primary) hypertension: Secondary | ICD-10-CM

## 2014-05-20 DIAGNOSIS — N529 Male erectile dysfunction, unspecified: Secondary | ICD-10-CM

## 2014-05-20 DIAGNOSIS — L909 Atrophic disorder of skin, unspecified: Secondary | ICD-10-CM

## 2014-05-20 DIAGNOSIS — E785 Hyperlipidemia, unspecified: Secondary | ICD-10-CM

## 2014-05-20 DIAGNOSIS — Z7253 High risk bisexual behavior: Secondary | ICD-10-CM

## 2014-05-20 DIAGNOSIS — M109 Gout, unspecified: Secondary | ICD-10-CM

## 2014-05-20 DIAGNOSIS — Z7251 High risk heterosexual behavior: Secondary | ICD-10-CM

## 2014-05-20 DIAGNOSIS — L918 Other hypertrophic disorders of the skin: Secondary | ICD-10-CM

## 2014-05-20 DIAGNOSIS — Z79899 Other long term (current) drug therapy: Secondary | ICD-10-CM

## 2014-05-20 DIAGNOSIS — Z8673 Personal history of transient ischemic attack (TIA), and cerebral infarction without residual deficits: Secondary | ICD-10-CM

## 2014-05-20 DIAGNOSIS — L919 Hypertrophic disorder of the skin, unspecified: Secondary | ICD-10-CM

## 2014-05-20 DIAGNOSIS — R319 Hematuria, unspecified: Secondary | ICD-10-CM

## 2014-05-20 LAB — COMPLETE METABOLIC PANEL WITH GFR
ALT: 23 U/L (ref 0–53)
AST: 21 U/L (ref 0–37)
Albumin: 4.4 g/dL (ref 3.5–5.2)
Alkaline Phosphatase: 53 U/L (ref 39–117)
BILIRUBIN TOTAL: 0.5 mg/dL (ref 0.2–1.2)
BUN: 20 mg/dL (ref 6–23)
CHLORIDE: 99 meq/L (ref 96–112)
CO2: 29 mEq/L (ref 19–32)
Calcium: 9.7 mg/dL (ref 8.4–10.5)
Creat: 0.86 mg/dL (ref 0.50–1.35)
GFR, Est African American: >89 mL/min
GFR, Est Non African American: >89 mL/min
Glucose, Bld: 83 mg/dL (ref 70–99)
Potassium: 4.8 mEq/L (ref 3.5–5.3)
Sodium: 137 mEq/L (ref 135–145)
Total Protein: 7.3 g/dL (ref 6.0–8.3)

## 2014-05-20 LAB — POCT URINALYSIS DIPSTICK
Bilirubin, UA: NEGATIVE
Glucose, UA: NEGATIVE
Ketones, UA: NEGATIVE
LEUKOCYTES UA: NEGATIVE
Nitrite, UA: NEGATIVE
Protein, UA: NEGATIVE
SPEC GRAV UA: 1.01
UROBILINOGEN UA: 0.2
pH, UA: 7

## 2014-05-20 LAB — CBC WITH DIFFERENTIAL/PLATELET
BASOS ABS: 0.1 10*3/uL (ref 0.0–0.1)
Basophils Relative: 1 % (ref 0–1)
Eosinophils Absolute: 0.1 10*3/uL (ref 0.0–0.7)
Eosinophils Relative: 1 % (ref 0–5)
HCT: 43.7 % (ref 39.0–52.0)
Hemoglobin: 14.9 g/dL (ref 13.0–17.0)
LYMPHS PCT: 16 % (ref 12–46)
Lymphs Abs: 1.5 10*3/uL (ref 0.7–4.0)
MCH: 29.6 pg (ref 26.0–34.0)
MCHC: 34.1 g/dL (ref 30.0–36.0)
MCV: 86.9 fL (ref 78.0–100.0)
Monocytes Absolute: 0.6 10*3/uL (ref 0.1–1.0)
Monocytes Relative: 7 % (ref 3–12)
Neutro Abs: 6.8 10*3/uL (ref 1.7–7.7)
Neutrophils Relative %: 75 % (ref 43–77)
PLATELETS: 269 10*3/uL (ref 150–400)
RBC: 5.03 MIL/uL (ref 4.22–5.81)
RDW: 13.6 % (ref 11.5–15.5)
WBC: 9.1 10*3/uL (ref 4.0–10.5)

## 2014-05-20 LAB — POCT UA - MICROSCOPIC ONLY
CASTS, UR, LPF, POC: NEGATIVE
Crystals, Ur, HPF, POC: NEGATIVE
EPITHELIAL CELLS, URINE PER MICROSCOPY: NEGATIVE
Mucus, UA: NEGATIVE
WBC, Ur, HPF, POC: NEGATIVE
Yeast, UA: NEGATIVE

## 2014-05-20 MED ORDER — PRAVASTATIN SODIUM 40 MG PO TABS: 40.0000 mg | ORAL_TABLET | Freq: Every day | ORAL | Status: DC

## 2014-05-20 MED ORDER — COLCHICINE 0.6 MG PO TABS: ORAL_TABLET | ORAL | Status: DC

## 2014-05-20 NOTE — Patient Instructions (Signed)
Restart your pravastatin (cholesterol) medication and restart the amlodipine (norvasc) blood pressure medication in addition to the lisinopril. We will want to see you back for a full physical with FASTING labs (nothing to eat for about 8 hours beforehand - after midnight the night prior is best.

## 2014-05-20 NOTE — Progress Notes (Signed)
Subjective:    Patient ID: Joseph [name], male    DOB: Apr 02, 1951, 63 y.o.   MRN: [id number] Chief Complaint  Patient presents with  . Referral    TO UROLOGIST - per patient has been blood in urine    HPI  3 wks previously pt had an aggressive round of intercourse with his girlfriend on top. He then had some penile pain and urinated marroon blood clots.  He stated that after he voided about 3 times, his urine progressively lightened and was completely clear by the morning. He waited  Past Medical History  Diagnosis Date  . Stroke   . Hypertension   . Arthritis   . Gout   . ED (erectile dysfunction)   . Dyslipidemia    Current Outpatient Prescriptions on File Prior to Visit  Medication Sig Dispense Refill  . lisinopril (PRINIVIL,ZESTRIL) 20 MG tablet Take 2 tablets (40 mg total) by mouth daily.  180 tablet  3  . amLODipine (NORVASC) 5 MG tablet Take 1 tablet (5 mg total) by mouth daily.  90 tablet  3   No current facility-administered medications on file prior to visit.   No Known Allergies   Review of Systems  Constitutional: Negative for fever, chills, diaphoresis, activity change and appetite change.  Respiratory: Negative for shortness of breath.   Cardiovascular: Negative for chest pain.  Gastrointestinal: Positive for abdominal pain, constipation and abdominal distention. Negative for nausea, vomiting, diarrhea, blood in stool, anal bleeding and rectal pain.  Genitourinary: Positive for hematuria, discharge, difficulty urinating and penile pain. Negative for dysuria, urgency, frequency, decreased urine volume, penile swelling, scrotal swelling and testicular pain.  Hematological: Negative for adenopathy.      BP 156/100  Pulse 77  Temp(Src) 98.6 F (37 C) (Oral)  Resp 16  Ht 5' 8.5" (1.74 m)  Wt 189 lb 9.6 oz (86.002 kg)  BMI 28.41 kg/m2  SpO2 97% Objective:   Physical Exam       2 skin tags on forehead cleaned with EtOH x 3. Larger tag of 5 mm dm on  pedunculated base anesthetized w/ 0.3cc of 1% lidocaine with epi subcu.  Removed with scissors - 2nd skin tag 1-392mm dm at base so removed w/o anesthesia. Pt tolerated procedure very well. Hemostasis achieved w/ pressure and drysol. 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 behavior - Plan: POCT UA - Microscopic Only, POCT urinalysis dipstick, CBC with Differential, COMPLETE METABOLIC PANEL WITH GFR, HIV antibody, RPR, GC/Chlamydia Probe Amp, PSA, Urine culture  Other and unspecified hyperlipidemia - Plan: CBC with Differential  Unspecified essential hypertension - Plan: CBC with Differential, COMPLETE METABOLIC PANEL WITH GFR, PSA  Blood in urine - Plan: POCT UA - Microscopic Only, POCT urinalysis dipstick, GC/Chlamydia Probe Amp, Urine culture, Ambulatory referral to Urology  Erectile dysfunction - Plan: PSA, Ambulatory referral to Urology  History of stroke - needs to stay on statin - goal LDL <100  Encounter for long-term (current) use of other medications  Hyperlipidemia - Plan: pravastatin (PRAVACHOL) 40 MG tablet - restart pravastatin - check lfts at f/u. Needs flp in 4-6 mos to ensure at goal (LDL <100)  Gout - Plan: colchicine 0.6 MG tablet - take 2 tabs followed by 1 an hour later at first sign of gout attack  Skin tag - removed 2 skin tags on forehead today   Meds ordered this encounter  Medications  . pravastatin (PRAVACHOL) 40 MG tablet    Sig: Take 1 tablet (40  mg total) by mouth at bedtime.    Dispense:  90 tablet    Refill:  1  . colchicine 0.6 MG tablet    Sig: Take 2 pills once, then take another an hour later    Dispense:  20 tablet    Refill:  1     Norberto Sorenson, MD MPH

## 2014-05-21 LAB — RPR

## 2014-05-21 LAB — HIV ANTIBODY (ROUTINE TESTING W REFLEX): HIV: NONREACTIVE

## 2014-05-21 LAB — GC/CHLAMYDIA PROBE AMP
CT PROBE, AMP APTIMA: NEGATIVE
GC PROBE AMP APTIMA: NEGATIVE

## 2014-05-21 LAB — PSA: PSA: 5.46 ng/mL — AB (ref <=4.00)

## 2014-05-22 LAB — URINE CULTURE
COLONY COUNT: NO GROWTH
Organism ID, Bacteria: NO GROWTH

## 2014-06-21 ENCOUNTER — Encounter (HOSPITAL_BASED_OUTPATIENT_CLINIC_OR_DEPARTMENT_OTHER): Payer: Self-pay | Admitting: Emergency Medicine

## 2014-06-21 ENCOUNTER — Emergency Department (HOSPITAL_BASED_OUTPATIENT_CLINIC_OR_DEPARTMENT_OTHER): Payer: BC Managed Care – PPO

## 2014-06-21 ENCOUNTER — Ambulatory Visit (INDEPENDENT_AMBULATORY_CARE_PROVIDER_SITE_OTHER): Payer: BC Managed Care – PPO | Admitting: Internal Medicine

## 2014-06-21 ENCOUNTER — Ambulatory Visit (INDEPENDENT_AMBULATORY_CARE_PROVIDER_SITE_OTHER): Payer: BC Managed Care – PPO

## 2014-06-21 ENCOUNTER — Inpatient Hospital Stay (HOSPITAL_BASED_OUTPATIENT_CLINIC_OR_DEPARTMENT_OTHER)
Admission: EM | Admit: 2014-06-21 | Discharge: 2014-06-23 | DRG: 389 | Disposition: A | Discharge status: Home / Self Care | Payer: BC Managed Care – PPO | Attending: Internal Medicine | Admitting: Internal Medicine

## 2014-06-21 VITALS — BP 174/100 | HR 103 | Temp 98.1°F | Ht 68.5 in | Wt 188.4 lb

## 2014-06-21 DIAGNOSIS — Z79899 Other long term (current) drug therapy: Secondary | ICD-10-CM

## 2014-06-21 DIAGNOSIS — E785 Hyperlipidemia, unspecified: Secondary | ICD-10-CM | POA: Diagnosis present

## 2014-06-21 DIAGNOSIS — Q438 Other specified congenital malformations of intestine: Secondary | ICD-10-CM

## 2014-06-21 DIAGNOSIS — K5900 Constipation, unspecified: Secondary | ICD-10-CM | POA: Diagnosis present

## 2014-06-21 DIAGNOSIS — R52 Pain, unspecified: Secondary | ICD-10-CM

## 2014-06-21 DIAGNOSIS — Z860100 Personal history of colon polyps, unspecified: Secondary | ICD-10-CM

## 2014-06-21 DIAGNOSIS — R109 Unspecified abdominal pain: Secondary | ICD-10-CM

## 2014-06-21 DIAGNOSIS — R319 Hematuria, unspecified: Secondary | ICD-10-CM

## 2014-06-21 DIAGNOSIS — I10 Essential (primary) hypertension: Secondary | ICD-10-CM | POA: Diagnosis present

## 2014-06-21 DIAGNOSIS — Z823 Family history of stroke: Secondary | ICD-10-CM

## 2014-06-21 DIAGNOSIS — M109 Gout, unspecified: Secondary | ICD-10-CM | POA: Diagnosis present

## 2014-06-21 DIAGNOSIS — R972 Elevated prostate specific antigen [PSA]: Secondary | ICD-10-CM

## 2014-06-21 DIAGNOSIS — Z8673 Personal history of transient ischemic attack (TIA), and cerebral infarction without residual deficits: Secondary | ICD-10-CM

## 2014-06-21 DIAGNOSIS — N529 Male erectile dysfunction, unspecified: Secondary | ICD-10-CM | POA: Diagnosis present

## 2014-06-21 DIAGNOSIS — K562 Volvulus: Principal | ICD-10-CM

## 2014-06-21 DIAGNOSIS — R1032 Left lower quadrant pain: Secondary | ICD-10-CM | POA: Diagnosis present

## 2014-06-21 HISTORY — DX: Male erectile dysfunction, unspecified: N52.9

## 2014-06-21 HISTORY — DX: Gout, unspecified: M10.9

## 2014-06-21 HISTORY — DX: Hyperlipidemia, unspecified: E78.5

## 2014-06-21 LAB — COMPREHENSIVE METABOLIC PANEL
ALBUMIN: 4.1 g/dL (ref 3.5–5.2)
ALK PHOS: 49 U/L (ref 39–117)
ALT: 27 U/L (ref 0–53)
ANION GAP: 13 (ref 5–15)
AST: 31 U/L (ref 0–37)
BILIRUBIN TOTAL: 0.6 mg/dL (ref 0.3–1.2)
BUN: 13 mg/dL (ref 6–23)
CHLORIDE: 101 meq/L (ref 96–112)
CO2: 26 mEq/L (ref 19–32)
Calcium: 9.7 mg/dL (ref 8.4–10.5)
Creatinine, Ser: 0.8 mg/dL (ref 0.50–1.35)
GFR calc Af Amer: >90 mL/min (ref >90)
GFR calc non Af Amer: >90 mL/min (ref >90)
Glucose, Bld: 136 mg/dL — ABNORMAL HIGH (ref 70–99)
Potassium: 3.8 mEq/L (ref 3.7–5.3)
SODIUM: 140 meq/L (ref 137–147)
Total Protein: 7.4 g/dL (ref 6.0–8.3)

## 2014-06-21 LAB — POCT UA - MICROSCOPIC ONLY
CRYSTALS, UR, HPF, POC: NEGATIVE
Casts, Ur, LPF, POC: NEGATIVE
Yeast, UA: NEGATIVE

## 2014-06-21 LAB — CBC WITH DIFFERENTIAL/PLATELET
Basophils Absolute: 0 10*3/uL (ref 0.0–0.1)
Basophils Relative: 0 % (ref 0–1)
Eosinophils Absolute: 0 10*3/uL (ref 0.0–0.7)
Eosinophils Relative: 0 % (ref 0–5)
HCT: 41.3 % (ref 39.0–52.0)
HEMOGLOBIN: 14.4 g/dL (ref 13.0–17.0)
Lymphocytes Relative: 8 % — ABNORMAL LOW (ref 12–46)
Lymphs Abs: 0.8 10*3/uL (ref 0.7–4.0)
MCH: 30.3 pg (ref 26.0–34.0)
MCHC: 34.9 g/dL (ref 30.0–36.0)
MCV: 86.8 fL (ref 78.0–100.0)
MONOS PCT: 6 % (ref 3–12)
Monocytes Absolute: 0.6 10*3/uL (ref 0.1–1.0)
NEUTROS ABS: 8.2 10*3/uL — AB (ref 1.7–7.7)
Neutrophils Relative %: 85 % — ABNORMAL HIGH (ref 43–77)
PLATELETS: 191 10*3/uL (ref 150–400)
RBC: 4.76 MIL/uL (ref 4.22–5.81)
RDW: 13.4 % (ref 11.5–15.5)
WBC: 9.6 10*3/uL (ref 4.0–10.5)

## 2014-06-21 LAB — POCT SEDIMENTATION RATE: POCT SED RATE: 11 mm/hr (ref 0–22)

## 2014-06-21 LAB — POCT URINALYSIS DIPSTICK
Bilirubin, UA: NEGATIVE
Glucose, UA: NEGATIVE
KETONES UA: NEGATIVE
Leukocytes, UA: NEGATIVE
Nitrite, UA: NEGATIVE
PROTEIN UA: 100
RBC UA: NEGATIVE
SPEC GRAV UA: 1.02
Urobilinogen, UA: 1
pH, UA: 6

## 2014-06-21 LAB — IFOBT (OCCULT BLOOD): IMMUNOLOGICAL FECAL OCCULT BLOOD TEST: NEGATIVE

## 2014-06-21 LAB — I-STAT CG4 LACTIC ACID, ED: Lactic Acid, Venous: 0.83 mmol/L (ref 0.5–2.2)

## 2014-06-21 MED ORDER — IOHEXOL 300 MG/ML  SOLN
100.0000 mL | Freq: Once | INTRAMUSCULAR | Status: AC | PRN
  Administered 2014-06-21: 100 mL via INTRAVENOUS

## 2014-06-21 MED ORDER — IOHEXOL 300 MG/ML  SOLN
50.0000 mL | Freq: Once | INTRAMUSCULAR | Status: AC | PRN
  Administered 2014-06-21: 50 mL via ORAL

## 2014-06-21 MED ORDER — MORPHINE SULFATE 4 MG/ML IJ SOLN
4.0000 mg | Freq: Once | INTRAMUSCULAR | Status: AC
  Administered 2014-06-21: 4 mg via INTRAVENOUS
  Filled 2014-06-21: qty 1

## 2014-06-21 MED ORDER — ONDANSETRON HCL 4 MG/2ML IJ SOLN
4.0000 mg | Freq: Once | INTRAMUSCULAR | Status: AC
  Administered 2014-06-21: 4 mg via INTRAVENOUS
  Filled 2014-06-21: qty 2

## 2014-06-21 NOTE — ED Notes (Signed)
abd pain x 1 week  Worse today  Decreased bm

## 2014-06-21 NOTE — ED Notes (Signed)
Sent here from [facility] for evaluation of abd pain. Pt states he has "really bad" constipation and needs to be checked.

## 2014-06-21 NOTE — Patient Instructions (Signed)
To [facility] for emergency evaluation

## 2014-06-21 NOTE — Progress Notes (Addendum)
Subjective:  This chart was scribed for Joseph Siaobert Doolittle, MD by Evon Slackerrance Branch, ED Scribe. This Patient was seen in room 02 and the patients care was started at 6:11 PM   Patient ID: Joseph [name], male    DOB: 03-04-1951, 63 y.o.   MRN: [id number]  HPI Chief Complaint  Patient presents with  . Constipation    x 1 week    HPI Comments: Joseph [name] is a 63 y.o. male who presents to the Emergency Department complaining of being unable to have a BM for 1 week. Marland Kitchen. He was given Mg Citrate by the pharmacy this am and has increased bloating and pain all day. Uncommon for him to be constipated. No N,V. Eating today makes him worse. Colonoscopy reported as wnl within last 5 yrs. Pain unbearable he says.  He was here 1 mo ago to see Dr Clelia CroftShaw w/ hematuria after IC [facility]no appt yet. He states that he notices the hematuria after having sex. He states that when he is Overlyexcited he "notices" the blood.  He states he has a history of CVA in July 2011, but no sequelae.   Patient Active Problem List   Diagnosis Date Noted  . History of stroke 06/22/2013  . Encounter for long-term (current) use of other medications 10/26/2012  . Hypertension 10/26/2012  . Hyperlipidemia 10/26/2012  . Gout 10/26/2012   Prior to Admission medications   Medication Sig Start Date End Date Taking? Authorizing Provider  amLODipine (NORVASC) 5 MG tablet Take 1 tablet (5 mg total) by mouth daily. 03/25/14  Yes Gwenlyn FoundJessica C Copland, MD  colchicine 0.6 MG tablet Take 2 pills once, then take another an hour later 05/20/14  Yes [name], MD  lisinopril (PRINIVIL,ZESTRIL) 20 MG tablet Take 2 tablets (40 mg total) by mouth daily. 03/25/14  Yes Gwenlyn FoundJessica C Copland, MD  pravastatin (PRAVACHOL) 40 MG tablet Take 1 tablet (40 mg total) by mouth at bedtime. 05/20/14  Yes [name], MD  sildenafil (VIAGRA) 100 MG tablet Take 1 tablet (100 mg total) by mouth as needed for erectile dysfunction. 06/22/13   Yes Peyton Najjaravid H Hopper, MD  No chg meds/taking all History reviewed. No pertinent past surgical history.    Review of Systems  Constitutional: Positive for fever and chills. Negative for diaphoresis, fatigue and unexpected weight change.       Thinks had a fever earlier this week.  Respiratory: Negative for cough and shortness of breath.   Cardiovascular: Negative for chest pain, palpitations and leg swelling.  Gastrointestinal: Positive for abdominal pain, constipation and abdominal distention. Negative for nausea, vomiting, diarrhea, anal bleeding and rectal pain.  Genitourinary: Positive for hematuria. Negative for frequency, flank pain, decreased urine volume, discharge, scrotal swelling, difficulty urinating, penile pain and testicular pain.  Musculoskeletal: Negative for back pain.    Objective:    Physical Exam  Nursing note and vitals reviewed. Constitutional: He is oriented to person, place, and time. He appears well-developed and well-nourished. He appears distressed.  In obvious abd pain  HENT:  Head: Normocephalic and atraumatic.  Eyes: Conjunctivae and EOM are normal. Pupils are equal, round, and reactive to light.  Neck: Neck supple. No thyromegaly present.  Cardiovascular: Regular rhythm, normal heart sounds and intact distal pulses.   Tachy//1-2/6 sem lsb and base//no carotid bruits  Pulmonary/Chest: Effort normal and breath sounds normal. No respiratory distress. He has no wheezes. He has no rales. He exhibits no  tenderness.  Abdominal: He exhibits distension. He exhibits no mass. There is tenderness. There is no rebound and no guarding.  Abdomen distended,tympanitic but quiet BS. No organomegaly noted//fullness w/out discrete mass RMQ Not very tender to palp except over RMQ,RLQ  Genitourinary: Rectum normal. Guaiac negative stool. Prostate is enlarged. Prostate is not tender.  No stool or mass in rectum, Prostates is large but soft and symmetrical and non tender.     Musculoskeletal: Normal range of motion. He exhibits no edema.  Lymphadenopathy:    He has no cervical adenopathy.  Neurological: He is alert and oriented to person, place, and time. No cranial nerve deficit.  Skin:  Papular rash lower extrem like psoriasis  BP 174/100  Pulse 103  Temp(Src) 98.1 F (36.7 C) (Oral)  Ht 5' 8.5" (1.74 m)  Wt 188 lb 6.4 oz (85.458 kg)  BMI 28.23 kg/m2  SpO2 96%  [facility] reading (PRIMARY) by  Dr. Doolittle=colon dilated greatly by stool and air/upright suggests SB airfluid levels Diaphr intact/chest clear. Results for orders placed in visit on 06/21/14  POCT UA - MICROSCOPIC ONLY      Result Value Ref Range   WBC, Ur, HPF, POC 0-1     RBC, urine, microscopic 0-1     Bacteria, U Microscopic trace     Mucus, UA trace     Epithelial cells, urine per micros 0-2     Crystals, Ur, HPF, POC neg     Casts, Ur, LPF, POC neg     Yeast, UA neg    POCT URINALYSIS DIPSTICK      Result Value Ref Range   Color, UA yellow     Clarity, UA clear     Glucose, UA neg     Bilirubin, UA neg     Ketones, UA neg     Spec Grav, UA 1.020     Blood, UA neg     pH, UA 6.0     Protein, UA 100     Urobilinogen, UA 1.0     Nitrite, UA neg     Leukocytes, UA Negative    IFOBT (OCCULT BLOOD)      Result Value Ref Range   IFOBT Negative     40min OV//was moved by triage to be seen urgently  Assessment & Plan:  Abdominal pain, acute - he rates it 10/10  Hematuria - intermittent, after sex//clear tonight Elevated PSA--no urol f/u yet--I'll call them tomorrow  See radiol interp Due to pain he will go to ER for eval/acute labs and probable CT to r/o obstruction before any bowel purge can be done Our CBC machine has been out for 1 week and his labs will not be ready tonight      I have completed the patient encounter in its entirety as documented by the scribe, with editing by me where necessary. Robert P. Merla Richesoolittle, M.D.

## 2014-06-21 NOTE — ED Provider Notes (Addendum)
CSN: [id number]     Arrival date & time 06/21/14  2107 History  This chart was scribed for Shon Baton, MD by Bronson Curb, ED Scribe. This patient was seen in room MH02/MH02 and the patient's care was started at 9:49 PM.     Chief Complaint  Patient presents with  . Constipation      The history is provided by the patient. No language interpreter was used.    HPI Comments: Joseph [name] is a 63 y.o. male who presents to the Emergency Department complaining of constipation onset 3 days ago. Patient was sent here from [facility] to be evaluated for his symptoms. He reports daily bowel movements at baseline. There is associated diffuse abdominal pain, abdominal distention, and nausea. Patient rates his pain as 10/10. Patient has taken fleets enema and mag citrate with no improvement. He denies emesis. Patient has history of stroke, HTN, and arthritis.  Patient had x-ray done at urgent care which showed dilated loops of colon. He was here for further evaluation.   Past Medical History  Diagnosis Date  . Stroke   . Hypertension   . Arthritis    History reviewed. No pertinent past surgical history. Family History  Problem Relation Age of Onset  . Stroke Father    History  Substance Use Topics  . Smoking status: Never Smoker   . Smokeless tobacco: Not on file  . Alcohol Use: No    Review of Systems  Constitutional: Negative.  Negative for fever.  Respiratory: Negative.  Negative for chest tightness and shortness of breath.   Cardiovascular: Negative.  Negative for chest pain.  Gastrointestinal: Positive for nausea, abdominal pain and constipation. Negative for vomiting, diarrhea and blood in stool.  Genitourinary: Negative.  Negative for dysuria.  Musculoskeletal: Negative for back pain.  Neurological: Negative for headaches.  All other systems reviewed and are negative.     Allergies  Review of patient's allergies indicates no known allergies.  Home  Medications   Prior to Admission medications   Medication Sig Start Date End Date Taking? Authorizing Provider  amLODipine (NORVASC) 5 MG tablet Take 1 tablet (5 mg total) by mouth daily. 03/25/14   Pearline Cables, MD  colchicine 0.6 MG tablet Take 2 pills once, then take another an hour later 05/20/14   Sherren Mocha, MD  lisinopril (PRINIVIL,ZESTRIL) 20 MG tablet Take 2 tablets (40 mg total) by mouth daily. 03/25/14   Gwenlyn Found Copland, MD  pravastatin (PRAVACHOL) 40 MG tablet Take 1 tablet (40 mg total) by mouth at bedtime. 05/20/14   Sherren Mocha, MD  sildenafil (VIAGRA) 100 MG tablet Take 1 tablet (100 mg total) by mouth as needed for erectile dysfunction. 06/22/13   Peyton Najjar, MD   Triage Vitals: BP 161/105  Pulse 95  Temp(Src) 98 F (36.7 C) (Oral)  Resp 20  Ht 5\' 11"  (1.803 m)  Wt 182 lb (82.555 kg)  BMI 25.40 kg/m2  SpO2 97%  Physical Exam  Nursing note and vitals reviewed. Constitutional: He is oriented to person, place, and time. He appears well-developed and well-nourished.  Uncomfortable appearing  HENT:  Head: Normocephalic and atraumatic.  Mouth/Throat: Oropharynx is clear and moist.  Eyes: Pupils are equal, round, and reactive to light.  Neck: Neck supple.  Cardiovascular: Normal rate, regular rhythm and normal heart sounds.   No murmur heard. Pulmonary/Chest: Effort normal and breath sounds normal. No respiratory distress. He has no wheezes.  Abdominal: Soft. Bowel sounds  are normal. He exhibits distension. He exhibits no mass. There is tenderness. There is no rebound and no guarding.  Diffuse tenderness to palpation  Genitourinary:  Rectal exam deferred, performed earlier at urgent care with guaiac negative stool  Musculoskeletal: He exhibits no edema.  Lymphadenopathy:    He has no cervical adenopathy.  Neurological: He is alert and oriented to person, place, and time.  Skin: Skin is warm and dry.  Psychiatric: He has a normal mood and affect.    ED Course   Procedures (including critical care time)  CRITICAL CARE Performed by: Ross MarcusHORTON, Shevawn Langenberg, F   Total critical care time: 35 min  Critical care time was exclusive of separately billable procedures and treating other patients.  Critical care was necessary to treat or prevent imminent or life-threatening deterioration.  Critical care was time spent personally by me on the following activities: development of treatment plan with patient and/or surrogate as well as nursing, discussions with consultants, evaluation of patient's response to treatment, examination of patient, obtaining history from patient or surrogate, ordering and performing treatments and interventions, ordering and review of laboratory studies, ordering and review of radiographic studies, pulse oximetry and re-evaluation of patient's condition.   DIAGNOSTIC STUDIES: Oxygen Saturation is 97% on room air, adequate by my interpretation.    COORDINATION OF CARE: At 2155 Discussed treatment plan with patient which includes morphine injection. Patient agrees.   Labs Review Labs Reviewed  CBC WITH DIFFERENTIAL - Abnormal; Notable for the following:    Neutrophils Relative % 85 (*)    Neutro Abs 8.2 (*)    Lymphocytes Relative 8 (*)    All other components within normal limits  COMPREHENSIVE METABOLIC PANEL - Abnormal; Notable for the following:    Glucose, Bld 136 (*)    All other components within normal limits  LACTIC ACID, PLASMA  I-STAT CG4 LACTIC ACID, ED    Imaging Review Dg Chest 1 View  06/21/2014   CLINICAL DATA:  Acute abdominal pain for 5 hr.  EXAM: CHEST - 1 VIEW  COMPARISON:  Chest radiographs 06/18/2010.  FINDINGS: There are lower lung volumes with mild resulting bibasilar atelectasis. No consolidation, pleural effusion or pneumothorax is seen. The heart size and mediastinal contours are stable. Dilated colon is noted in the upper abdomen with colonic interposition on the right.  IMPRESSION: Low lung volumes  with resulting bibasilar atelectasis. Dilated colon.   Electronically Signed   By: Roxy HorsemanBill  Veazey M.D.   On: 06/21/2014 19:07   Ct Abdomen Pelvis W Contrast  06/21/2014   CLINICAL DATA:  Abdominal pain and distention.  Constipation.  EXAM: CT ABDOMEN AND PELVIS WITH CONTRAST  TECHNIQUE: Multidetector CT imaging of the abdomen and pelvis was performed using the standard protocol following bolus administration of intravenous contrast.  CONTRAST:  100mL OMNIPAQUE IOHEXOL 300 MG/ML  SOLN  COMPARISON:  None.  FINDINGS: The abdominal parenchymal organs are normal in appearance except for a few tiny renal cysts. No evidence hydronephrosis. No soft tissue masses or lymphadenopathy identified within the abdomen or pelvis.  Large amount of stool seen within the colon. The sigmoid colon is dilated, with appearance of closed loop obstruction inferiorly in the pelvis and inflammatory stranding throughout the sigmoid mesocolon. This is consistent with sigmoid volvulus. There is no evidence of pneumatosis or portal venous gas. Mild ascites noted in the right abdomen.  IMPRESSION: Findings consistent with sigmoid volvulus.  Mild ascites.  No evidence of pneumatosis or free air.   Electronically Signed  By: Myles RosenthalJohn  Stahl M.D.   On: 06/21/2014 23:22   Dg Abd 2 Views  06/21/2014   CLINICAL DATA:  Acute abdominal pain for 5 hr.  EXAM: ABDOMEN - 2 VIEW  COMPARISON:  None.  FINDINGS: Massive gaseous distended colon, transverse colon is at least 10 cm with large amount of retained large bowel stool. Small bowel air-fluid levels. No intra-abdominal mass effect. No definite free air though limited assessment due to at colonic air distention. Soft tissue planes and included osseous structures are nonsuspicious.  IMPRESSION: Massively dilated colon with a large amount of large bowel stool, in addition to scattered small bowel air-fluid level suggests ileus or possible distal large bowel obstruction. Recommend short-term follow-up to verify  improvement.  Per included comments, ordering clinician aware findings.   Electronically Signed   By: Awilda Metroourtnay  Bloomer   On: 06/21/2014 19:28     EKG Interpretation None      MDM   Final diagnoses:  Sigmoid volvulus    Patient presents with abdominal pain and constipation. Noted to have an abnormal KUB at urgent care. He is nontoxic but does have a distended abdomen and is diffusely tender. No signs of peritonitis. Patient was given pain medication and Zofran.  Lab work is largely reassuring with a normal lactate. CT of the abdomen shows evidence of a sigmoid volvulus. We'll discuss with surgery and [facility]  I personally performed the services described in this documentation, which was scribed in my presence. The recorded information has been reviewed and is accurate.   Shon Batonourtney F Sadiya Durand, MD 06/21/14 2329  Surgery, Daphine DeutscherMartin recommends GI decompression.  Patient to be evaluated by [facility] and [facility].  Shon Batonourtney F Finesse Fielder, MD 06/22/14 2159

## 2014-06-22 ENCOUNTER — Encounter (HOSPITAL_BASED_OUTPATIENT_CLINIC_OR_DEPARTMENT_OTHER): Payer: Self-pay | Admitting: Emergency Medicine

## 2014-06-22 ENCOUNTER — Inpatient Hospital Stay (HOSPITAL_COMMUNITY): Payer: BC Managed Care – PPO

## 2014-06-22 DIAGNOSIS — K562 Volvulus: Principal | ICD-10-CM

## 2014-06-22 DIAGNOSIS — I10 Essential (primary) hypertension: Secondary | ICD-10-CM

## 2014-06-22 DIAGNOSIS — K5900 Constipation, unspecified: Secondary | ICD-10-CM

## 2014-06-22 DIAGNOSIS — E785 Hyperlipidemia, unspecified: Secondary | ICD-10-CM

## 2014-06-22 DIAGNOSIS — R1032 Left lower quadrant pain: Secondary | ICD-10-CM | POA: Diagnosis present

## 2014-06-22 LAB — CBC
HCT: 39.3 % (ref 39.0–52.0)
Hemoglobin: 13.2 g/dL (ref 13.0–17.0)
MCH: 29.5 pg (ref 26.0–34.0)
MCHC: 33.6 g/dL (ref 30.0–36.0)
MCV: 87.9 fL (ref 78.0–100.0)
Platelets: 186 10*3/uL (ref 150–400)
RBC: 4.47 MIL/uL (ref 4.22–5.81)
RDW: 13.5 % (ref 11.5–15.5)
WBC: 9 10*3/uL (ref 4.0–10.5)

## 2014-06-22 LAB — CBC WITH DIFFERENTIAL/PLATELET
BASOS ABS: 0 10*3/uL (ref 0.0–0.1)
Basophils Relative: 0 % (ref 0–1)
EOS PCT: 0 % (ref 0–5)
Eosinophils Absolute: 0 10*3/uL (ref 0.0–0.7)
HEMATOCRIT: 43.2 % (ref 39.0–52.0)
Hemoglobin: 14.9 g/dL (ref 13.0–17.0)
Lymphocytes Relative: 10 % — ABNORMAL LOW (ref 12–46)
Lymphs Abs: 1 10*3/uL (ref 0.7–4.0)
MCH: 29.3 pg (ref 26.0–34.0)
MCHC: 34.5 g/dL (ref 30.0–36.0)
MCV: 85 fL (ref 78.0–100.0)
Monocytes Absolute: 0.5 10*3/uL (ref 0.1–1.0)
Monocytes Relative: 5 % (ref 3–12)
NEUTROS ABS: 8.8 10*3/uL — AB (ref 1.7–7.7)
Neutrophils Relative %: 85 % — ABNORMAL HIGH (ref 43–77)
Platelets: 215 10*3/uL (ref 150–400)
RBC: 5.08 MIL/uL (ref 4.22–5.81)
RDW: 14 % (ref 11.5–15.5)
WBC: 10.3 10*3/uL (ref 4.0–10.5)

## 2014-06-22 LAB — COMPREHENSIVE METABOLIC PANEL
ALT: 28 U/L (ref 0–53)
AST: 28 U/L (ref 0–37)
Albumin: 4.7 g/dL (ref 3.5–5.2)
Alkaline Phosphatase: 49 U/L (ref 39–117)
BUN: 13 mg/dL (ref 6–23)
CALCIUM: 9.9 mg/dL (ref 8.4–10.5)
CHLORIDE: 101 meq/L (ref 96–112)
CO2: 28 meq/L (ref 19–32)
Creat: 0.9 mg/dL (ref 0.50–1.35)
GLUCOSE: 121 mg/dL — AB (ref 70–99)
Potassium: 3.6 mEq/L (ref 3.5–5.3)
SODIUM: 139 meq/L (ref 135–145)
TOTAL PROTEIN: 7.7 g/dL (ref 6.0–8.3)
Total Bilirubin: 0.8 mg/dL (ref 0.2–1.2)

## 2014-06-22 LAB — CREATININE, SERUM
Creatinine, Ser: 0.92 mg/dL (ref 0.50–1.35)
GFR calc Af Amer: >90 mL/min (ref >90)
GFR calc non Af Amer: 88 mL/min — ABNORMAL LOW (ref >90)

## 2014-06-22 MED ORDER — ENOXAPARIN SODIUM 40 MG/0.4ML ~~LOC~~ SOLN
40.0000 mg | SUBCUTANEOUS | Status: DC
  Filled 2014-06-22: qty 0.4

## 2014-06-22 MED ORDER — PEG-KCL-NACL-NASULF-NA ASC-C 100 G PO SOLR
0.5000 | Freq: Once | ORAL | Status: AC
  Administered 2014-06-23: 100 g via ORAL
  Filled 2014-06-22 (×2): qty 1

## 2014-06-22 MED ORDER — POLYETHYLENE GLYCOL 3350 17 G PO PACK
17.0000 g | PACK | ORAL | Status: AC
  Administered 2014-06-22 (×2): 17 g via ORAL
  Filled 2014-06-22 (×2): qty 1

## 2014-06-22 MED ORDER — SODIUM CHLORIDE 0.9 % IV SOLN
Freq: Once | INTRAVENOUS | Status: AC
  Administered 2014-06-22: 1000 mL via INTRAVENOUS

## 2014-06-22 MED ORDER — PEG-KCL-NACL-NASULF-NA ASC-C 100 G PO SOLR: 1.0000 | Freq: Once | ORAL | Status: DC

## 2014-06-22 MED ORDER — SODIUM CHLORIDE 0.9 % IV SOLN
INTRAVENOUS | Status: DC
  Administered 2014-06-22 (×2): via INTRAVENOUS

## 2014-06-22 MED ORDER — SODIUM CHLORIDE 0.9 % IV SOLN
INTRAVENOUS | Status: DC
  Administered 2014-06-23: 500 mL via INTRAVENOUS
  Administered 2014-06-23: 06:00:00 via INTRAVENOUS

## 2014-06-22 MED ORDER — ONDANSETRON HCL 4 MG PO TABS: 4.0000 mg | ORAL_TABLET | Freq: Four times a day (QID) | ORAL | Status: DC | PRN

## 2014-06-22 MED ORDER — PEG-KCL-NACL-NASULF-NA ASC-C 100 G PO SOLR
0.5000 | Freq: Once | ORAL | Status: AC
  Administered 2014-06-22: 100 g via ORAL
  Filled 2014-06-22: qty 1

## 2014-06-22 MED ORDER — HYDROMORPHONE HCL PF 1 MG/ML IJ SOLN
0.5000 mg | INTRAMUSCULAR | Status: DC | PRN
Start: 2014-06-22 — End: 2014-06-23

## 2014-06-22 MED ORDER — ONDANSETRON HCL 4 MG/2ML IJ SOLN: 4.0000 mg | Freq: Four times a day (QID) | INTRAMUSCULAR | Status: DC | PRN

## 2014-06-22 MED ORDER — ACETAMINOPHEN 325 MG PO TABS: 650.0000 mg | ORAL_TABLET | Freq: Four times a day (QID) | ORAL | Status: DC | PRN

## 2014-06-22 MED ORDER — PANTOPRAZOLE SODIUM 40 MG IV SOLR
40.0000 mg | INTRAVENOUS | Status: DC
  Filled 2014-06-22: qty 40

## 2014-06-22 MED ORDER — HYDRALAZINE HCL 20 MG/ML IJ SOLN
5.0000 mg | Freq: Four times a day (QID) | INTRAMUSCULAR | Status: DC | PRN
Start: 2014-06-22 — End: 2014-06-23

## 2014-06-22 MED ORDER — OXYCODONE HCL 5 MG PO TABS: 5.0000 mg | ORAL_TABLET | ORAL | Status: DC | PRN

## 2014-06-22 MED ORDER — ACETAMINOPHEN 650 MG RE SUPP: 650.0000 mg | Freq: Four times a day (QID) | RECTAL | Status: DC | PRN

## 2014-06-22 NOTE — Consult Note (Addendum)
Consultation  Referring Provider:     Hospitalist Primary [facility] Physician:  [facility] Primary Gastroenterologist:       New/Unassigned  Reason for Consultation:     Sigmoid Volvulus    Impression / Plan:   1) Sigmoid volvulus (suspected)  - spontaneously reduced it seems   - repeat abdominal film this AM  - suspect he will need a colonoscopy tomorrow   - surgical consult vs. Office visit soon  - if no intervention today may have clears          HPI:   Joseph [name] is a 63 y.o. [place] male with no prior GI problems - has not had a bowel movement in 5 days. He has had progressively worsening abdominal distention and lower abdominal painthat intensified yesterday. He had tried an OTC laxative 2 d ago without help. He was worse so yesterday tried a saline enema after consulting with a pharmacist - as instructed he went for help after no results. [facility] and [facility] to ED where xrays and CT suggested a sigmoid volvulus. Overnight he spontaneously passed gas, no stool or only scant and feels much better. He does not have chronic constipation, rectal bleeding or other bowel irregularity.GI ROS is o/w negative  He has had 2 colonoscopies - last 2011 in [place], "ok" and one here ? 2008 - "ok" - for rectal bleeding.  Past Medical History  Diagnosis Date  . Stroke   . Hypertension   . Arthritis   . Gout   . ED (erectile dysfunction)   . Dyslipidemia    Past Surgical History  Procedure Laterality Date  . Colonoscopy  multiple      Family History  Problem Relation Age of Onset  . Stroke Father    \  History  Substance Use Topics  . Smoking status: Never Smoker   . Smokeless tobacco: No  . Alcohol Use: No   History   Social History Narrative   Married, 9 children, 8 in [place]   Works in a [employer] (light work)   No alcohol, drugs, tobacco   In [place] since 1998     Prior to Admission medications   Medication Sig Start Date End Date Taking?  Authorizing Provider  amLODipine (NORVASC) 5 MG tablet Take 1 tablet (5 mg total) by mouth daily. 03/25/14   Pearline CablesJessica C Copland, MD  colchicine 0.6 MG tablet Take 2 pills once, then take another an hour later 05/20/14   [name], MD  lisinopril (PRINIVIL,ZESTRIL) 20 MG tablet Take 2 tablets (40 mg total) by mouth daily. 03/25/14   Gwenlyn FoundJessica C Copland, MD  pravastatin (PRAVACHOL) 40 MG tablet Take 1 tablet (40 mg total) by mouth at bedtime. 05/20/14   [name], MD  sildenafil (VIAGRA) 100 MG tablet Take 1 tablet (100 mg total) by mouth as needed for erectile dysfunction. 06/22/13   Peyton Najjaravid H Hopper, MD    Current Facility-Administered Medications  Medication Dose Route Frequency Provider Last Rate Last Dose  . enoxaparin (LOVENOX) injection 40 mg  40 mg Subcutaneous Q24H Iva Booparl E Gessner, MD        Allergies as of 06/21/2014  . (No Known Allergies)     Review of Systems:    This is positive for those things mentioned in the HPI, also positive for post-coital hematuria All other review of systems are negative.       Physical Exam:  Vital signs in  last 24 hours: Temp:  [98 F (36.7 C)-98.7 F (37.1 C)] 98.7 F (37.1 C) (07/08 0520) Pulse Rate:  [84-103] 84 (07/08 0520) Resp:  [18-20] 18 (07/08 0520) BP: (144-174)/(85-105) 147/85 mmHg (07/08 0520) SpO2:  [96 %-98 %] 96 % (07/08 0520) Weight:  [182 lb (82.555 kg)-188 lb 6.4 oz (85.458 kg)] 185 lb 6.4 oz (84.097 kg) (07/08 0520)    General:  Well-developed, well-nourished and in no acute distress Eyes:  anicteric. ENT:   Mouth and posterior pharynx free of lesions. Teeth fair Lungs: Clear to auscultation bilaterally. Heart:  S1S2, no rubs, murmurs, gallops. Abdomen:  soft, non-tender, no hepatosplenomegaly, hernia, or mass and BS+. Some increased tympany in RUQ Rectal: At [facility] no stool or mass - not repeated Lymph:  no cervical or supraclavicular adenopathy. Extremities:   no edema Skin   no rash. Neuro:  A&O x 3.  Psych:  appropriate  mood and  Affect.   Data Reviewed:   LAB RESULTS:  Recent Labs  06/21/14 2205  WBC 9.6  HGB 14.4  HCT 41.3  PLT 191   BMET  Recent Labs  06/21/14 2205  NA 140  K 3.8  CL 101  CO2 26  GLUCOSE 136*  BUN 13  CREATININE 0.80  CALCIUM 9.7   LFT  Recent Labs  06/21/14 2205  PROT 7.4  ALBUMIN 4.1  AST 31  ALT 27  ALKPHOS 49  BILITOT 0.6      STUDIES: Images viewed Dg Chest 1 View  06/21/2014   CLINICAL DATA:  Acute abdominal pain for 5 hr.  EXAM: CHEST - 1 VIEW  COMPARISON:  Chest radiographs 06/18/2010.  FINDINGS: There are lower lung volumes with mild resulting bibasilar atelectasis. No consolidation, pleural effusion or pneumothorax is seen. The heart size and mediastinal contours are stable. Dilated colon is noted in the upper abdomen with colonic interposition on the right.  IMPRESSION: Low lung volumes with resulting bibasilar atelectasis. Dilated colon.   Electronically Signed   By: Roxy Horseman M.D.   On: 06/21/2014 19:07   Ct Abdomen Pelvis W Contrast  06/21/2014   CLINICAL DATA:  Abdominal pain and distention.  Constipation.  EXAM: CT ABDOMEN AND PELVIS WITH CONTRAST  TECHNIQUE: Multidetector CT imaging of the abdomen and pelvis was performed using the standard protocol following bolus administration of intravenous contrast.  CONTRAST:  OMNIPAQUE IOHEXOL 300 MG/ML  SOLN  COMPARISON:  None.  FINDINGS: The abdominal parenchymal organs are normal in appearance except for a few tiny renal cysts. No evidence hydronephrosis. No soft tissue masses or lymphadenopathy identified within the abdomen or pelvis.  Large amount of stool seen within the colon. The sigmoid colon is dilated, with appearance of closed loop obstruction inferiorly in the pelvis and inflammatory stranding throughout the sigmoid mesocolon. This is consistent with sigmoid volvulus. There is no evidence of pneumatosis or portal venous gas. Mild ascites noted in the right abdomen.  IMPRESSION: Findings  consistent with sigmoid volvulus.  Mild ascites.  No evidence of pneumatosis or free air.   Electronically Signed   By: Myles Rosenthal M.D.   On: 06/21/2014 23:22   Dg Abd 2 Views  06/21/2014   CLINICAL DATA:  Acute abdominal pain for 5 hr.  EXAM: ABDOMEN - 2 VIEW  COMPARISON:  None.  FINDINGS: Massive gaseous distended colon, transverse colon is at least 10 cm with large amount of retained large bowel stool. Small bowel air-fluid levels. No intra-abdominal mass effect. No definite free air  though limited assessment due to at colonic air distention. Soft tissue planes and included osseous structures are nonsuspicious.  IMPRESSION: Massively dilated colon with a large amount of large bowel stool, in addition to scattered small bowel air-fluid level suggests ileus or possible distal large bowel obstruction. Recommend short-term follow-up to verify improvement.  Per included comments, ordering clinician aware findings.   Electronically Signed   By: Awilda Metroourtnay  Bloomer   On: 06/21/2014 19:28     PREVIOUS ENDOSCOPIES:            2 colonoscopies as above   Thanks   LOS: 1 day   @Carl  Sena SlateE. Gessner, MD, [facility] @  06/22/2014, 6:06 AM

## 2014-06-22 NOTE — Plan of Care (Signed)
Problem: Food- and Nutrition-Related Knowledge Deficit (NB-1.1) Goal: Nutrition education Formal process to instruct or train a patient/client in a skill or to impart knowledge to help patients/clients voluntarily manage or modify food choices and eating behavior to maintain or improve health. Outcome: Completed/Met Date Met:  06/22/14  RD consulted for nutrition education regarding constipation. RD provided "Constipation Nutrition Therapy" handout from the Academy of [employer]etics. Discussed different food groups and their fiber content, emphasizing gradual addition of higher fiber-containing foods as symptoms resolve. Provided list of goods to eat and foods to avoid. Teach back method used.  Expect good compliance.  Body mass index is 25.87 kg/(m^2). Pt meets criteria for overweight based on current BMI.  Current diet order is NPO. Labs and medications reviewed. No further nutrition interventions warranted at this time. RD contact information provided. If additional nutrition issues arise, please re-consult RD.  Inda Coke MS, RD, LDN Pager: [phone] After-hours pager: [phone]

## 2014-06-22 NOTE — H&P (Signed)
[facility] History and Physical  Joseph LutesOvido G [name] ZOX:[id number] DOB: 11/22/1951 DOA: 06/21/2014  Referring physician:  EDP PCP: [address]ittle [address] [facility]:   Chief Complaint:   Constipation  HPI: Joseph [name] is a 63 y.o. male with a history of HTN, and hyperlipidemia, Gout,a dn Previous CVA, who presents to the ED with complaints of worsening  LLQ Abd Pain and Distension with Ostipation x 1 week.   He reports going to his pharmacy and getting laxative pills and taking 4 tablets without relief, he then spoke to the pharmacy who advised for him to take Mag citrate and he did and had not results so [facility].   A ct scan was performed in the ED which revealed a Sigmoid Volvulus.    The EDP contacted general Surgery on call and GI on call, and Dr Leone PayorGessner of GI was consulted to see the patient this AM to evalauate for a possible Colonic Decompression.     Review of Systems:  Constitutional: No Weight Loss, No Weight Gain, Night Sweats, Fevers, Chills, Fatigue, or Generalized Weakness HEENT: No Headaches, Difficulty Swallowing,Tooth/Dental Problems,Sore Throat,  No Sneezing, Rhinitis, Ear Ache, Nasal Congestion, or Post Nasal Drip,  Cardio-vascular:  No Chest pain, Orthopnea, PND, Edema in lower extremities, Anasarca, Dizziness, Palpitations  Resp: No Dyspnea, No DOE, No Productive Cough, No Non-Productive Cough, No Hemoptysis, No Change in Color of Mucus,  No Wheezing.    GI: No Heartburn, Indigestion, +Abdominal Pain, Nausea, Vomiting,  +Constipation, Diarrhea, Change in Bowel Habits,  +Loss of Appetite  GU: No Dysuria, Change in Color of Urine, No Urgency or Frequency.  No flank pain.  Musculoskeletal: No Joint Pain or Swelling.  No Decreased Range of Motion. No Back Pain.  Neurologic: No Syncope, No Seizures, Muscle Weakness, Paresthesia, Vision Disturbance or Loss, No Diplopia, No Vertigo, No Difficulty Walking,  Skin: No Rash or Lesions. Psych: No  Change in Mood or Affect. No Depression or Anxiety. No Memory loss. No Confusion or Hallucinations   Past Medical History  Diagnosis Date  . Stroke   . Hypertension   . Arthritis   . Gout   . ED (erectile dysfunction)   . Dyslipidemia     Past Surgical History  Procedure Laterality Date  . Colonoscopy  multiple     Prior to Admission medications   Medication Sig Start Date End Date Taking? Authorizing Provider  amLODipine (NORVASC) 5 MG tablet Take 1 tablet (5 mg total) by mouth daily. 03/25/14   Pearline CablesJessica C Copland, MD  colchicine 0.6 MG tablet Take 2 pills once, then take another an hour later 05/20/14   [name], MD  lisinopril (PRINIVIL,ZESTRIL) 20 MG tablet Take 2 tablets (40 mg total) by mouth daily. 03/25/14   Gwenlyn FoundJessica C Copland, MD  pravastatin (PRAVACHOL) 40 MG tablet Take 1 tablet (40 mg total) by mouth at bedtime. 05/20/14   [name], MD  sildenafil (VIAGRA) 100 MG tablet Take 1 tablet (100 mg total) by mouth as needed for erectile dysfunction. 06/22/13   Peyton Najjaravid H Hopper, MD      No Known Allergies   Social History:  reports that he has never smoked. He does not have any smokeless tobacco history on file. He reports that he does not drink alcohol or use illicit drugs.     Family History  Problem Relation Age of Onset  . Stroke Father        Physical Exam:  GEN:  Pleasant Well Nourished and Well Developed  63 y.o. male examined  and in no acute distress; cooperative with exam Filed Vitals:   06/21/14 2121 06/22/14 0050 06/22/14 0347 06/22/14 0520  BP: 161/105 147/92 144/93 147/85  Pulse: 95 84 92 84  Temp: 98 F (36.7 C) 98.2 F (36.8 C)  98.7 F (37.1 C)  TempSrc: Oral Oral  Oral  Resp: 20 18 18 18   Height: 5\' 11"  (1.803 m)     Weight: 82.555 kg (182 lb)   84.097 kg (185 lb 6.4 oz)  SpO2: 97% 98% 98% 96%   Blood pressure 147/85, pulse 84, temperature 98.7 F (37.1 C), temperature source Oral, resp. rate 18, height 5\' 11"  (1.803 m), weight 84.097 kg (185  lb 6.4 oz), SpO2 96.00%. PSYCH: He is alert and oriented x4; does not appear anxious does not appear depressed; affect is normal HEENT: Normocephalic and Atraumatic, Mucous membranes pink; PERRLA; EOM intact; Fundi:  Benign;  No scleral icterus, Nares: Patent, Oropharynx: Clear,  Fair Dentition, Neck:  FROM, no cervical lymphadenopathy nor thyromegaly or carotid bruit; no JVD; Breasts:: Not examined CHEST WALL: No tenderness CHEST: Normal respiration, clear to auscultation bilaterally HEART: Regular rate and rhythm; no murmurs rubs or gallops BACK: No kyphosis or scoliosis; no CVA tenderness ABDOMEN: Positive Bowel Sounds, Scaphoid,  Mild Distension,  non-tender; no masses, no organomegaly.   Rectal Exam: Not done EXTREMITIES: No cyanosis, clubbing or edema; no ulcerations. Genitalia: not examined PULSES: 2+ and symmetric SKIN: Normal hydration no rash or ulceration CNS:  Alert and oriented x 4. No Focal Deficits Vascular: pulses palpable throughout    Labs on Admission:  Basic Metabolic Panel:  Recent Labs Lab 06/21/14 2205  NA 140  K 3.8  CL 101  CO2 26  GLUCOSE 136*  BUN 13  CREATININE 0.80  CALCIUM 9.7   Liver Function Tests:  Recent Labs Lab 06/21/14 2205  AST 31  ALT 27  ALKPHOS 49  BILITOT 0.6  PROT 7.4  ALBUMIN 4.1   No results found for this basename: LIPASE, AMYLASE,  in the last 168 hours No results found for this basename: AMMONIA,  in the last 168 hours CBC:  Recent Labs Lab 06/21/14 2205  WBC 9.6  NEUTROABS 8.2*  HGB 14.4  HCT 41.3  MCV 86.8  PLT 191   Cardiac Enzymes: No results found for this basename: CKTOTAL, CKMB, CKMBINDEX, TROPONINI,  in the last 168 hours  BNP (last 3 results) No results found for this basename: PROBNP,  in the last 8760 hours CBG: No results found for this basename: GLUCAP,  in the last 168 hours  Radiological Exams on Admission: Dg Chest 1 View  06/21/2014   CLINICAL DATA:  Acute abdominal pain for 5 hr.   EXAM: CHEST - 1 VIEW  COMPARISON:  Chest radiographs 06/18/2010.  FINDINGS: There are lower lung volumes with mild resulting bibasilar atelectasis. No consolidation, pleural effusion or pneumothorax is seen. The heart size and mediastinal contours are stable. Dilated colon is noted in the upper abdomen with colonic interposition on the right.  IMPRESSION: Low lung volumes with resulting bibasilar atelectasis. Dilated colon.   Electronically Signed   By: Roxy Horseman M.D.   On: 06/21/2014 19:07   Ct Abdomen Pelvis W Contrast  06/21/2014   CLINICAL DATA:  Abdominal pain and distention.  Constipation.  EXAM: CT ABDOMEN AND PELVIS WITH CONTRAST  TECHNIQUE: Multidetector CT imaging of the abdomen and pelvis was performed using the standard  protocol following bolus administration of intravenous contrast.  CONTRAST:  100mL OMNIPAQUE IOHEXOL 300 MG/ML  SOLN  COMPARISON:  None.  FINDINGS: The abdominal parenchymal organs are normal in appearance except for a few tiny renal cysts. No evidence hydronephrosis. No soft tissue masses or lymphadenopathy identified within the abdomen or pelvis.  Large amount of stool seen within the colon. The sigmoid colon is dilated, with appearance of closed loop obstruction inferiorly in the pelvis and inflammatory stranding throughout the sigmoid mesocolon. This is consistent with sigmoid volvulus. There is no evidence of pneumatosis or portal venous gas. Mild ascites noted in the right abdomen.  IMPRESSION: Findings consistent with sigmoid volvulus.  Mild ascites.  No evidence of pneumatosis or free air.   Electronically Signed   By: Myles RosenthalJohn  Stahl M.D.   On: 06/21/2014 23:22   Dg Abd 2 Views  06/21/2014   CLINICAL DATA:  Acute abdominal pain for 5 hr.  EXAM: ABDOMEN - 2 VIEW  COMPARISON:  None.  FINDINGS: Massive gaseous distended colon, transverse colon is at least 10 cm with large amount of retained large bowel stool. Small bowel air-fluid levels. No intra-abdominal mass effect. No  definite free air though limited assessment due to at colonic air distention. Soft tissue planes and included osseous structures are nonsuspicious.  IMPRESSION: Massively dilated colon with a large amount of large bowel stool, in addition to scattered small bowel air-fluid level suggests ileus or possible distal large bowel obstruction. Recommend short-term follow-up to verify improvement.  Per included comments, ordering clinician aware findings.   Electronically Signed   By: Awilda Metroourtnay  Bloomer   On: 06/21/2014 19:28       Assessment/Plan:   63 y.o. male with  Principal Problem:   Sigmoid volvulus Active Problems:   Abdominal pain, left lower quadrant   Obstipation   Hypertension   Hyperlipidemia   Gout    1.  Sigmoid Volvulus-  GI:  Dr Leone PayorGessner Seeing, Appears to be improving, has passed flatus, and has less ABDistension and pain att his time.       A repeat KUB is ordered for this AM for re-evaluation, to see if Colonic Decompression will be needed.  NPO at this time.    2.   ABD Pain and Obstipation due to #1.     3.   HTN-  IV Hydralazine PRN while NPO.     4.   Hyperlipidemia- on Statin Rx at home, resume when taking PO.     5.   SCDs for DVT prophylaxis.       Code Status:  FULL CODE Family Communication:    Wife at Bedside Disposition Plan:     Observation    Time spent:  5860 Minutes  Ron ParkerJENKINS,Valeta Paz C [facility] Pager [phone]  If 7PM-7AM, please contact night-coverage [email or web address] Password [email or web address] 06/22/2014, 6:57 AM

## 2014-06-22 NOTE — Consult Note (Signed)
Will follow after colonoscopy.  Wilmon ArmsMatthew K. Corliss Skainssuei, MD, [facility]  General/ Trauma Surgery  06/22/2014 4:48 PM

## 2014-06-22 NOTE — Progress Notes (Signed)
PROGRESS NOTE  Joseph [name] [id number]:[id number] DOB: [birth year] DOA: 06/21/2014 PCP: No PCP Per Patient  Assessment/Plan: Sigmoid volvulus -clinically improving-->passing flatus with improving dilatation on abdominal xray -I already consulted general surgery -appreciate GI followup -colonoscopy plans noted for 06/23/14 -tap water enemas -06/21/2014 CT abdomen and pelvis consistent with sigmoid volvulus -06/22/2014 2 view abdominal x-ray shows improving dilated small bowel Hypertension -Remained stable off of antihypertensive medications -Restart lisinopril and amlodipine Hyperlipidemia -Restart statin when patient is stable Constipation -Recommendations per GI  Family Communication:   Significant other at beside Disposition Plan:   Home when medically stable       Procedures/Studies: Dg Chest 1 View  06/21/2014   CLINICAL DATA:  Acute abdominal pain for 5 hr.  EXAM: CHEST - 1 VIEW  COMPARISON:  Chest radiographs 06/18/2010.  FINDINGS: There are lower lung volumes with mild resulting bibasilar atelectasis. No consolidation, pleural effusion or pneumothorax is seen. The heart size and mediastinal contours are stable. Dilated colon is noted in the upper abdomen with colonic interposition on the right.  IMPRESSION: Low lung volumes with resulting bibasilar atelectasis. Dilated colon.   Electronically Signed   By: Camie Patience M.D.   On: 06/21/2014 19:07   Ct Abdomen Pelvis W Contrast  06/21/2014   CLINICAL DATA:  Abdominal pain and distention.  Constipation.  EXAM: CT ABDOMEN AND PELVIS WITH CONTRAST  TECHNIQUE: Multidetector CT imaging of the abdomen and pelvis was performed using the standard protocol following bolus administration of intravenous contrast.  CONTRAST:  15m OMNIPAQUE IOHEXOL 300 MG/ML  SOLN  COMPARISON:  None.  FINDINGS: The abdominal parenchymal organs are normal in appearance except for a few tiny renal cysts. No evidence hydronephrosis. No soft tissue masses or  lymphadenopathy identified within the abdomen or pelvis.  Large amount of stool seen within the colon. The sigmoid colon is dilated, with appearance of closed loop obstruction inferiorly in the pelvis and inflammatory stranding throughout the sigmoid mesocolon. This is consistent with sigmoid volvulus. There is no evidence of pneumatosis or portal venous gas. Mild ascites noted in the right abdomen.  IMPRESSION: Findings consistent with sigmoid volvulus.  Mild ascites.  No evidence of pneumatosis or free air.   Electronically Signed   By: JEarle GellM.D.   On: 06/21/2014 23:22   Dg Abd 2 Views  06/22/2014   CLINICAL DATA:  Abdominal pain.  EXAM: ABDOMEN - 2 VIEW  COMPARISON:  June 21, 2014.  FINDINGS: No abnormal bowel dilatation is noted. Stool is noted throughout the colon. Dilated bowel noted on prior exam appears to have resolved. Residual contrast is noted in the urinary bladder. There is no evidence of free air. No radio-opaque calculi or other significant radiographic abnormality is seen.  IMPRESSION: Dilated large bowel noted on prior exam is significantly improved. Large amount of stool is now seen throughout the colon concerning for possible constipation.   Electronically Signed   By: JSabino DickM.D.   On: 06/22/2014 09:04   Dg Abd 2 Views  06/21/2014   CLINICAL DATA:  Acute abdominal pain for 5 hr.  EXAM: ABDOMEN - 2 VIEW  COMPARISON:  None.  FINDINGS: Massive gaseous distended colon, transverse colon is at least 10 cm with large amount of retained large bowel stool. Small bowel air-fluid levels. No intra-abdominal mass effect. No definite free air though limited assessment due to at colonic air distention. Soft tissue planes and included osseous structures are nonsuspicious.  IMPRESSION: Massively dilated colon with a large amount of large bowel stool, in addition to scattered small bowel air-fluid level suggests ileus or possible distal large bowel obstruction. Recommend short-term follow-up to  verify improvement.  Per included comments, ordering clinician aware findings.   Electronically Signed   By: Elon Alas   On: 06/21/2014 19:28         Subjective: Patient is passing flatus but no bowel movement. Abdominal pain is significantly improved today. Denies fevers, chills, nausea, vomiting, diarrhea, chest pain, shortness breath, dysuria, hematuria. No hematochezia or melena.  Objective: Filed Vitals:   06/22/14 0050 06/22/14 0347 06/22/14 0520 06/22/14 1050  BP: 147/92 144/93 147/85 148/91  Pulse: 84 92 84 96  Temp: 98.2 F (36.8 C)  98.7 F (37.1 C) 98.6 F (37 C)  TempSrc: Oral  Oral Oral  Resp: '18 18 18 17  ' Height:      Weight:   84.097 kg (185 lb 6.4 oz)   SpO2: 98% 98% 96% 96%    Intake/Output Summary (Last 24 hours) at 06/22/14 1150 Last data filed at 06/22/14 1025  Gross per 24 hour  Intake    240 ml  Output      0 ml  Net    240 ml   Weight change:  Exam:   General:  Pt is alert, follows commands appropriately, not in acute distress  HEENT: No icterus, No thrush,  [place]/AT  Cardiovascular: RRR, S1/S2, no rubs, no gallops  Respiratory: CTA bilaterally, no wheezing, no crackles, no rhonchi  Abdomen: Soft/+BS, non tender, non distended, no guarding  Extremities: No edema, No lymphangitis, No petechiae, No rashes, no synovitis  Data Reviewed: Basic Metabolic Panel:  Recent Labs Lab 06/21/14 2205 06/22/14 0830  NA 140  --   K 3.8  --   CL 101  --   CO2 26  --   GLUCOSE 136*  --   BUN 13  --   CREATININE 0.80 0.92  CALCIUM 9.7  --    Liver Function Tests:  Recent Labs Lab 06/21/14 2205  AST 31  ALT 27  ALKPHOS 49  BILITOT 0.6  PROT 7.4  ALBUMIN 4.1   No results found for this basename: LIPASE, AMYLASE,  in the last 168 hours No results found for this basename: AMMONIA,  in the last 168 hours CBC:  Recent Labs Lab 06/21/14 2205 06/22/14 0830  WBC 9.6 9.0  NEUTROABS 8.2*  --   HGB 14.4 13.2  HCT 41.3 39.3  MCV  86.8 87.9  PLT 191 186   Cardiac Enzymes: No results found for this basename: CKTOTAL, CKMB, CKMBINDEX, TROPONINI,  in the last 168 hours BNP: No components found with this basename: POCBNP,  CBG: No results found for this basename: GLUCAP,  in the last 168 hours  No results found for this or any previous visit (from the past 240 hour(s)).   Scheduled Meds: . peg 3350 powder  0.5 kit Oral Once   And  . [START ON 06/23/2014] peg 3350 powder  0.5 kit Oral Once  . polyethylene glycol  17 g Oral Q4H   Continuous Infusions: . sodium chloride 75 mL/hr at 06/22/14 0709     Vivion Romano, DO  [facility] Pager [phone]  If 7PM-7AM, please contact night-coverage [email or web address] Password [email or web address] 06/22/2014, 11:50 AM   LOS: 1 day

## 2014-06-22 NOTE — Progress Notes (Signed)
Report received 

## 2014-06-22 NOTE — Consult Note (Signed)
[facility] Surgery Consult Note  MAXIMO SPRATLING 1951-04-04  [id number].    Requesting MD: Dr. Carles Collet Chief Complaint/Reason for Consult: Sigmoid volvulus  HPI:  63 y.o. male with a history of HTN, and hyperlipidemia, gout, and previous CVA, who presents to [facility] with complaints of worsening LLQ Abd Pain and Distension with constipation x 1 week.  He also hadn't passed any flatus.  He reports going to his pharmacy and getting laxative pills, Mag citrate, and saline enemas, and he did and had not results so he went to the [facility].  No other precipitating or alleviating factorsA CT scan was performed in the ED which revealed a Sigmoid Volvulus.  He has never had this ever before.  Since admission he had had significant improvement and multiple BM's and flatus.  His KUB this am has shown improvement as well.  The patient currently has 0/10 pain and has been getting enemas.  He does note some minimal abdominal distension which is improved.  No N/V abdominal pain, no CP/SOB, no current fevers/chills, no dysuria.     ROS: All systems reviewed and otherwise negative except for as above  Family History  Problem Relation Age of Onset  . Stroke Father     Past Medical History  Diagnosis Date  . Stroke   . Hypertension   . Arthritis   . Gout   . ED (erectile dysfunction)   . Dyslipidemia     Past Surgical History  Procedure Laterality Date  . Colonoscopy  multiple    Social History:  reports that he has never smoked. He does not have any smokeless tobacco history on file. He reports that he does not drink alcohol or use illicit drugs.  Allergies: No Known Allergies  Medications Prior to Admission  Medication Sig Dispense Refill  . amLODipine (NORVASC) 5 MG tablet Take 1 tablet (5 mg total) by mouth daily.  90 tablet  3  . colchicine 0.6 MG tablet Take 2 pills once, then take another an hour later  20 tablet  1  . colchicine 0.6 MG tablet Take 0.6-1.2 mg by mouth daily as needed (gout  attack). Take 2 tablets at onset of attack, then take 1 additional tablet an hour later      . lisinopril (PRINIVIL,ZESTRIL) 20 MG tablet Take 2 tablets (40 mg total) by mouth daily.  180 tablet  3  . pravastatin (PRAVACHOL) 40 MG tablet Take 1 tablet (40 mg total) by mouth at bedtime.  90 tablet  1    Blood pressure 148/91, pulse 96, temperature 98.6 F (37 C), temperature source Oral, resp. rate 17, height '5\' 11"'  (1.803 m), weight 185 lb 6.4 oz (84.097 kg), SpO2 96.00%. Physical Exam: General: pleasant, WD/WN filip[place] male who is laying in bed in NAD, smiling HEENT: head is normocephalic, atraumatic.  Sclera are noninjected.  Ears and nose without any masses or lesions.  Mouth is pink and moist Heart: regular, rate, and rhythm.  No obvious murmurs, gallops, or rubs noted.  Palpable pedal pulses bilaterally Lungs: CTAB, no wheezes, rhonchi, or rales noted.  Respiratory effort nonlabored Abd: soft, mild distension, NT, +BS, no masses, hernias, or organomegaly, no scars noted MS: all 4 extremities are symmetrical with no cyanosis, clubbing, or edema. Skin: warm and dry with no masses, lesions, or rashes Psych: A&Ox3 with an appropriate affect.   Results for orders placed during the hospital encounter of 06/21/14 (from the past 48 hour(s))  CBC WITH DIFFERENTIAL     Status:  Abnormal   Collection Time    06/21/14 10:05 PM      Result Value Ref Range   WBC 9.6  4.0 - 10.5 K/uL   RBC 4.76  4.22 - 5.81 MIL/uL   Hemoglobin 14.4  13.0 - 17.0 g/dL   HCT 41.3  39.0 - 52.0 %   MCV 86.8  78.0 - 100.0 fL   MCH 30.3  26.0 - 34.0 pg   MCHC 34.9  30.0 - 36.0 g/dL   RDW 13.4  11.5 - 15.5 %   Platelets 191  150 - 400 K/uL   Neutrophils Relative % 85 (*) 43 - 77 %   Neutro Abs 8.2 (*) 1.7 - 7.7 K/uL   Lymphocytes Relative 8 (*) 12 - 46 %   Lymphs Abs 0.8  0.7 - 4.0 K/uL   Monocytes Relative 6  3 - 12 %   Monocytes Absolute 0.6  0.1 - 1.0 K/uL   Eosinophils Relative 0  0 - 5 %   Eosinophils  Absolute 0.0  0.0 - 0.7 K/uL   Basophils Relative 0  0 - 1 %   Basophils Absolute 0.0  0.0 - 0.1 K/uL  COMPREHENSIVE METABOLIC PANEL     Status: Abnormal   Collection Time    06/21/14 10:05 PM      Result Value Ref Range   Sodium 140  137 - 147 mEq/L   Potassium 3.8  3.7 - 5.3 mEq/L   Chloride 101  96 - 112 mEq/L   CO2 26  19 - 32 mEq/L   Glucose, Bld 136 (*) 70 - 99 mg/dL   BUN 13  6 - 23 mg/dL   Creatinine, Ser 0.80  0.50 - 1.35 mg/dL   Calcium 9.7  8.4 - 10.5 mg/dL   Total Protein 7.4  6.0 - 8.3 g/dL   Albumin 4.1  3.5 - 5.2 g/dL   AST 31  0 - 37 U/L   Comment: HEMOLYZED SPECIMEN, RESULTS MAY BE AFFECTED   ALT 27  0 - 53 U/L   Alkaline Phosphatase 49  39 - 117 U/L   Total Bilirubin 0.6  0.3 - 1.2 mg/dL   GFR calc non Af Amer >90  >90 mL/min   GFR calc Af Amer >90  >90 mL/min   Comment: (NOTE)     The eGFR has been calculated using the CKD EPI equation.     This calculation has not been validated in all clinical situations.     eGFR's persistently <90 mL/min signify possible Chronic Kidney     Disease.   Anion gap 13  5 - 15  I-STAT CG4 LACTIC ACID, ED     Status: None   Collection Time    06/21/14 10:12 PM      Result Value Ref Range   Lactic Acid, Venous 0.83  0.5 - 2.2 mmol/L  CBC     Status: None   Collection Time    06/22/14  8:30 AM      Result Value Ref Range   WBC 9.0  4.0 - 10.5 K/uL   RBC 4.47  4.22 - 5.81 MIL/uL   Hemoglobin 13.2  13.0 - 17.0 g/dL   HCT 39.3  39.0 - 52.0 %   MCV 87.9  78.0 - 100.0 fL   MCH 29.5  26.0 - 34.0 pg   MCHC 33.6  30.0 - 36.0 g/dL   RDW 13.5  11.5 - 15.5 %   Platelets 186  150 -  400 K/uL  CREATININE, SERUM     Status: Abnormal   Collection Time    06/22/14  8:30 AM      Result Value Ref Range   Creatinine, Ser 0.92  0.50 - 1.35 mg/dL   GFR calc non Af Amer 88 (*) >90 mL/min   GFR calc Af Amer >90  >90 mL/min   Comment: (NOTE)     The eGFR has been calculated using the CKD EPI equation.     This calculation has not been  validated in all clinical situations.     eGFR's persistently <90 mL/min signify possible Chronic Kidney     Disease.   Dg Chest 1 View  06/21/2014   CLINICAL DATA:  Acute abdominal pain for 5 hr.  EXAM: CHEST - 1 VIEW  COMPARISON:  Chest radiographs 06/18/2010.  FINDINGS: There are lower lung volumes with mild resulting bibasilar atelectasis. No consolidation, pleural effusion or pneumothorax is seen. The heart size and mediastinal contours are stable. Dilated colon is noted in the upper abdomen with colonic interposition on the right.  IMPRESSION: Low lung volumes with resulting bibasilar atelectasis. Dilated colon.   Electronically Signed   By: Camie Patience M.D.   On: 06/21/2014 19:07   Ct Abdomen Pelvis W Contrast  06/21/2014   CLINICAL DATA:  Abdominal pain and distention.  Constipation.  EXAM: CT ABDOMEN AND PELVIS WITH CONTRAST  TECHNIQUE: Multidetector CT imaging of the abdomen and pelvis was performed using the standard protocol following bolus administration of intravenous contrast.  CONTRAST:  135m OMNIPAQUE IOHEXOL 300 MG/ML  SOLN  COMPARISON:  None.  FINDINGS: The abdominal parenchymal organs are normal in appearance except for a few tiny renal cysts. No evidence hydronephrosis. No soft tissue masses or lymphadenopathy identified within the abdomen or pelvis.  Large amount of stool seen within the colon. The sigmoid colon is dilated, with appearance of closed loop obstruction inferiorly in the pelvis and inflammatory stranding throughout the sigmoid mesocolon. This is consistent with sigmoid volvulus. There is no evidence of pneumatosis or portal venous gas. Mild ascites noted in the right abdomen.  IMPRESSION: Findings consistent with sigmoid volvulus.  Mild ascites.  No evidence of pneumatosis or free air.   Electronically Signed   By: JEarle GellM.D.   On: 06/21/2014 23:22   Dg Abd 2 Views  06/22/2014   CLINICAL DATA:  Abdominal pain.  EXAM: ABDOMEN - 2 VIEW  COMPARISON:  June 21, 2014.   FINDINGS: No abnormal bowel dilatation is noted. Stool is noted throughout the colon. Dilated bowel noted on prior exam appears to have resolved. Residual contrast is noted in the urinary bladder. There is no evidence of free air. No radio-opaque calculi or other significant radiographic abnormality is seen.  IMPRESSION: Dilated large bowel noted on prior exam is significantly improved. Large amount of stool is now seen throughout the colon concerning for possible constipation.   Electronically Signed   By: JSabino DickM.D.   On: 06/22/2014 09:04   Dg Abd 2 Views  06/21/2014   CLINICAL DATA:  Acute abdominal pain for 5 hr.  EXAM: ABDOMEN - 2 VIEW  COMPARISON:  None.  FINDINGS: Massive gaseous distended colon, transverse colon is at least 10 cm with large amount of retained large bowel stool. Small bowel air-fluid levels. No intra-abdominal mass effect. No definite free air though limited assessment due to at colonic air distention. Soft tissue planes and included osseous structures are nonsuspicious.  IMPRESSION: Massively dilated colon  with a large amount of large bowel stool, in addition to scattered small bowel air-fluid level suggests ileus or possible distal large bowel obstruction. Recommend short-term follow-up to verify improvement.  Per included comments, ordering clinician aware findings.   Electronically Signed   By: Elon Alas   On: 06/21/2014 19:28      Assessment/Plan Sigmoid volvulus - with spontaneous reduction  Plan: 1.  Does not need surgical interventions at this time.  His pain is 0/10 currently and his distension improved.  Labs are normal.  He has had several large bowel movements.   2.  May at some point need surgery if this were to happen again.  He can follow up with [place] in the office for this discussion.   3.  Tolerating clears well, may be able to advance diet, continue enemas and recheck films tomorrow 4.  SCD's  5.  Ambulate and IS 6.  Getting colonoscopy tomorrow  morning 7.  Discussed at length prevention of constipation, will likely need good bowel regimen once discharged.  Will have dietitian see the patient and give diet modifications.   Coralie Keens, [facility] Surgery 06/22/2014, 12:29 PM Pager: [phone]

## 2014-06-22 NOTE — Progress Notes (Signed)
New Admission Note:  Arrival Method: on a stretcher with care link RN Mental Orientation: Alert and orientedx4 Telemetry: N/A Assessment: Completed Skin: dry and intact IV: Left AC 06/21/14, saline locked Pain: denies pain  Tubes: N/A Safety Measures: Safety Fall Prevention Plan was given, discussed and signed. Admission: Completed [facility] Orientation: Patient has been orientated to the room, unit and the staff. Family: Spouse at bedside  Orders have been reviewed and implemented. Will continue to monitor the patient. Call light has been placed within reach.   Tempie DonningMercy Wendelin Reader BSN, RN  Phone Number: [phone] [facility] [place] [facility]

## 2014-06-22 NOTE — Progress Notes (Signed)
Utilization Review Completed.Eusebio Blazejewski T7/07/2014  

## 2014-06-22 NOTE — Progress Notes (Addendum)
Daily Rounding Note  06/22/2014, 10:03 AM  LOS: 1 day   SUBJECTIVE:       Feels better, passed a lot of gas and belly now flat and pain free.  No nausea.   No stools, just gas  OBJECTIVE:         Vital signs in last 24 hours:    Temp:  [98 F (36.7 C)-98.7 F (37.1 C)] 98.7 F (37.1 C) (07/08 0520) Pulse Rate:  [84-103] 84 (07/08 0520) Resp:  [18-20] 18 (07/08 0520) BP: (144-174)/(85-105) 147/85 mmHg (07/08 0520) SpO2:  [96 %-98 %] 96 % (07/08 0520) Weight:  [82.555 kg (182 lb)-85.458 kg (188 lb 6.4 oz)] 84.097 kg (185 lb 6.4 oz) (07/08 0520) Last BM Date: 06/15/14 General: comfortable, not acutely ill   Heart: RRR Chest: clear bil.  No dyspnea Abdomen: soft, hyperactive BS  Extremities: no pedal edema Neuro/Psych:  Pleasant, oriented x 3.  Alert, no gross deficits.   Intake/Output from previous day:    Intake/Output this shift:    Lab Results:  Recent Labs  06/21/14 2205 06/22/14 0830  WBC 9.6 9.0  HGB 14.4 13.2  HCT 41.3 39.3  PLT 191 186   BMET  Recent Labs  06/21/14 2205 06/22/14 0830  NA 140  --   K 3.8  --   CL 101  --   CO2 26  --   GLUCOSE 136*  --   BUN 13  --   CREATININE 0.80 0.92  CALCIUM 9.7  --    LFT  Recent Labs  06/21/14 2205  PROT 7.4  ALBUMIN 4.1  AST 31  ALT 27  ALKPHOS 49  BILITOT 0.6   PT/INR No results found for this basename: LABPROT, INR,  in the last 72 hours Hepatitis Panel No results found for this basename: HEPBSAG, HCVAB, HEPAIGM, HEPBIGM,  in the last 72 hours  Studies/Results: Dg Chest 1 View  06/21/2014   CLINICAL DATA:  Acute abdominal pain for 5 hr.  EXAM: CHEST - 1 VIEW  COMPARISON:  Chest radiographs 06/18/2010.  FINDINGS: There are lower lung volumes with mild resulting bibasilar atelectasis. No consolidation, pleural effusion or pneumothorax is seen. The heart size and mediastinal contours are stable. Dilated colon is noted in the upper  abdomen with colonic interposition on the right.  IMPRESSION: Low lung volumes with resulting bibasilar atelectasis. Dilated colon.   Electronically Signed   By: Roxy HorsemanBill  Veazey M.D.   On: 06/21/2014 19:07   Ct Abdomen Pelvis W Contrast  06/21/2014   CLINICAL DATA:  Abdominal pain and distention.  Constipation.  EXAM: CT ABDOMEN AND PELVIS WITH CONTRAST  TECHNIQUE: Multidetector CT imaging of the abdomen and pelvis was performed using the standard protocol following bolus administration of intravenous contrast.  CONTRAST:  100mL OMNIPAQUE IOHEXOL 300 MG/ML  SOLN  COMPARISON:  None.  FINDINGS: The abdominal parenchymal organs are normal in appearance except for a few tiny renal cysts. No evidence hydronephrosis. No soft tissue masses or lymphadenopathy identified within the abdomen or pelvis.  Large amount of stool seen within the colon. The sigmoid colon is dilated, with appearance of closed loop obstruction inferiorly in the pelvis and inflammatory stranding throughout the sigmoid mesocolon. This is consistent with sigmoid volvulus. There is no evidence of pneumatosis or portal venous gas. Mild ascites noted in the right abdomen.  IMPRESSION: Findings consistent with sigmoid volvulus.  Mild ascites.  No evidence of pneumatosis or free air.  Electronically Signed   By: John  Stahl M.D.   On: 06/21/2014 23:22   Dg Abd 2 Views  06/22/2014   CLINICAL DATA:  Abdominal pain.  EXAM: ABDOMEN - 2 VIEW  COMPARISON:  June 21, 2014.  FINDINGS: No abnormal bowel dilatation is noted. Stool is noted throughout the colon. Dilated bowel noted on prior exam appears to have resolved. Residual contrast is noted in the urinary bladder. There is no evidence of free air. No radio-opaque calculi or other significant radiographic abnormality is seen.  IMPRESSION: Dilated large bowel noted on prior exam is significantly improved. Large amount of stool is now seen throughout the colon concerning for possible constipation.   Electronically  Signed   By: James  Green M.D.   On: 06/22/2014 09:04   Dg Abd 2 Views 06/21/2014   CLINICAL DATA:  Acute abdominal pain for 5 hr.  EXAM: ABDOMEN - 2 VIEW  COMPARISON:  None.  FINDINGS: Massive gaseous distended colon, transverse colon is at least 10 cm with large amount of retained large bowel stool. Small bowel air-fluid levels. No intra-abdominal mass effect. No definite free air though limited assessment due to at colonic air distention. Soft tissue planes and included osseous structures are nonsuspicious.  IMPRESSION: Massively dilated colon with a large amount of large bowel stool, in addition to scattered small bowel air-fluid level suggests ileus or possible distal large bowel obstruction. Recommend short-term follow-up to verify improvement.  Per included comments, ordering clinician aware findings.   Electronically Signed   By: Courtnay  Bloomer   On: 06/21/2014 19:28    ASSESMENT:   *  Sigmoid volvulus?.  Resolved with lingering constipation/retained stool.  *  Hyperplastic and adenomatous colon polyps in 2003, colonoscopy by Dr Orr.  Subsequent colonoscopy 2009 or 2011 in [place], pt says no polyps on this study which was "ok", apparently had rectal bleeding then.    PLAN   *  Tap water enema.  Clear diet. Miralax *  No plans for colonoscopy today.     Sarah Gribbin  06/22/2014, 10:03 AM Pager: [phone]  ________________________________________________________________________  [facility] MD note:  I personally examined the patient, reviewed the data and agree with the assessment and plan described above. Moved his bowels, large amount.  Obviously the volvulous has spontaneously reduced.  We will ask general surgery team to see him while in hosp or to arrange for office visit relatively soon after discharge.  I plan on colonoscopy tomorrow, he has history of precancerous polyps and would like to have repeat examination prior to any potential surgery.  Granite Godman,  MD [facility] Pager [phone]  

## 2014-06-23 ENCOUNTER — Encounter (HOSPITAL_COMMUNITY): Payer: Self-pay | Admitting: *Deleted

## 2014-06-23 ENCOUNTER — Encounter (HOSPITAL_COMMUNITY)
Admission: EM | Disposition: A | Discharge status: Home / Self Care | Payer: Self-pay | Source: Home / Self Care | Attending: Internal Medicine

## 2014-06-23 HISTORY — PX: COLONOSCOPY: SHX5424

## 2014-06-23 LAB — CBC
HEMATOCRIT: 39.9 % (ref 39.0–52.0)
HEMOGLOBIN: 13.4 g/dL (ref 13.0–17.0)
MCH: 29.9 pg (ref 26.0–34.0)
MCHC: 33.6 g/dL (ref 30.0–36.0)
MCV: 89.1 fL (ref 78.0–100.0)
Platelets: 170 10*3/uL (ref 150–400)
RBC: 4.48 MIL/uL (ref 4.22–5.81)
RDW: 13.7 % (ref 11.5–15.5)
WBC: 6 10*3/uL (ref 4.0–10.5)

## 2014-06-23 LAB — BASIC METABOLIC PANEL
ANION GAP: 12 (ref 5–15)
BUN: 11 mg/dL (ref 6–23)
CHLORIDE: 103 meq/L (ref 96–112)
CO2: 26 meq/L (ref 19–32)
CREATININE: 0.85 mg/dL (ref 0.50–1.35)
Calcium: 8.8 mg/dL (ref 8.4–10.5)
GFR calc Af Amer: >90 mL/min (ref >90)
GFR calc non Af Amer: >90 mL/min (ref >90)
Glucose, Bld: 92 mg/dL (ref 70–99)
POTASSIUM: 3.6 meq/L — AB (ref 3.7–5.3)
Sodium: 141 mEq/L (ref 137–147)

## 2014-06-23 LAB — MAGNESIUM: Magnesium: 2.2 mg/dL (ref 1.5–2.5)

## 2014-06-23 SURGERY — COLONOSCOPY
Anesthesia: Moderate Sedation

## 2014-06-23 MED ORDER — FENTANYL CITRATE 0.05 MG/ML IJ SOLN
INTRAMUSCULAR | Status: DC | PRN
  Administered 2014-06-23 (×4): 25 ug via INTRAVENOUS

## 2014-06-23 MED ORDER — UNABLE TO FIND: Status: DC

## 2014-06-23 MED ORDER — MIDAZOLAM HCL 5 MG/5ML IJ SOLN
INTRAMUSCULAR | Status: DC | PRN
  Administered 2014-06-23: 2 mg via INTRAVENOUS
  Administered 2014-06-23: 1 mg via INTRAVENOUS
  Administered 2014-06-23: 2 mg via INTRAVENOUS

## 2014-06-23 MED ORDER — FENTANYL CITRATE 0.05 MG/ML IJ SOLN
INTRAMUSCULAR | Status: AC
  Filled 2014-06-23: qty 4

## 2014-06-23 MED ORDER — MIDAZOLAM HCL 5 MG/ML IJ SOLN
INTRAMUSCULAR | Status: AC
  Filled 2014-06-23: qty 2

## 2014-06-23 NOTE — Discharge Summary (Signed)
Physician Discharge Summary  [name] UEA:[id number] DOB: May 28, 1951 DOA: 06/21/2014  PCP: No PCP Per Patient  Admit date: 06/21/2014 Discharge date: 06/23/2014  Recommendations for Outpatient Follow-up:  1. Pt will need to follow up with PCP in 2 weeks post discharge 2. Please obtain BMP 3. Please also check CBC   Discharge Diagnoses:  Sigmoid volvulus  -clinically improved-->passing flatus with improving dilatation on abdominal xray  -Multiple bowel movements with enemas  -appreciate general surgery  -appreciate GI followup  -spoke with Dr. Christella [name] on day of d/c and he cleared pt for d/c -pt has appt with surgery, Dr. Maisie [name] on 07/11/14@915AM  -colonoscopy-long redundant colon, otherwise normal -plan advance diet after colonoscopy--pt tolerated -06/21/2014 CT abdomen and pelvis consistent with sigmoid volvulus  -06/22/2014 2 view abdominal x-ray shows improving dilated small bowel  Hypertension  -Remained stable off of antihypertensive medications  -Restart lisinopril and amlodipine after colonoscopy  Hyperlipidemia  -Restart statin  Constipation  -Recommendations per GI  -bowel regimen after d/c--miralax daily   Discharge Condition: stable  Disposition:  Follow-up Information   Follow up with [name] MD Today. (07/11/14 @ 915AM)    Specialty:  General Surgery   Contact information:   [address] [place] [address] [phone]       Follow up with [name]., MD. (07/11/14@915AM )    Specialty:  General Surgery   Contact information:   [address] [place] [place] [address] [phone]       Diet:heart healthy Wt Readings from Last 3 Encounters:  06/22/14 84.188 kg (185 lb 9.6 oz)  06/22/14 84.188 kg (185 lb 9.6 oz)  06/21/14 85.458 kg (188 lb 6.4 oz)    History of present illness:  63 y.o. male who presents to the [name] complaining of constipation x 3 days with worsening abdominal pain.   Patient was sent to  ED from [facility] to be evaluated for his symptoms. He reports daily bowel movements at baseline. There is associated diffuse abdominal pain, abdominal distention, and nausea. Patient rates his pain as 10/10. Patient has taken fleets enema and mag citrate with no improvement. He denies emesis. Patient has history of stroke, HTN, and arthritis.  CT abdomen suggested sigmoid volvulus.  GI and general surgery were consulted.  Pt improved spontaneously.  Enemas were ordered and pt had BMs.  GI performed colonoscopy on 06/23/14 which was neg.  General surgery recommended outpt follow to discuss future options for surgery.  Pt encouraged to take miralax daily.  Nutrition was consulted to assist with dietary recommendations.  Diet was advanced and pt tolerated it.     Consultants: [name]  Discharge Exam: Filed Vitals:   06/23/14 1353  BP: 147/87  Pulse: 73  Temp:   Resp: 17   Filed Vitals:   06/23/14 1330 06/23/14 1335 06/23/14 1345 06/23/14 1353  BP: 132/86 130/85 144/89 147/87  Pulse: 72 78 76 73  Temp:      TempSrc:      Resp: 24 17 18 17   Height:      Weight:      SpO2: 97% 97% 97% 96%   General: A&O x 3, NAD, pleasant, cooperative Cardiovascular: RRR, no rub, no gallop, no S3 Respiratory: CTAB, no wheeze, no rhonchi Abdomen:soft, nontender, nondistended, positive bowel sounds Extremities: No edema, No lymphangitis, no petechiae  Discharge Instructions      Discharge Instructions   Diet - low sodium heart healthy  Complete by:  As directed      Discharge instructions    Complete by:  As directed   Follow up with Dr. Romie [name] (General Surgery) on 07/11/14 @915AM  Take Miralax 17 grams daily.     Increase activity slowly    Complete by:  As directed             Medication List         amLODipine 5 MG tablet  Commonly known as:  NORVASC  Take 1 tablet (5 mg total) by mouth daily.     colchicine 0.6 MG tablet  Take 0.6-1.2 mg by mouth daily  as needed (gout attack). Take 2 tablets at onset of attack, then take 1 additional tablet an hour later     colchicine 0.6 MG tablet  Take 2 pills once, then take another an hour later     lisinopril 20 MG tablet  Commonly known as:  PRINIVIL,ZESTRIL  Take 2 tablets (40 mg total) by mouth daily.     pravastatin 40 MG tablet  Commonly known as:  PRAVACHOL  Take 1 tablet (40 mg total) by mouth at bedtime.     UNABLE TO FIND  [name] was [facility] to the [name] from 06/21/14 through 06/23/14.  He is medically stable to return to work.     UNABLE TO FIND  [name] was present in the [name] to assist in care from 06/21/14 through 07/03/14.         The results of significant diagnostics from this hospitalization (including imaging, microbiology, ancillary and laboratory) are listed below for reference.    Significant Diagnostic Studies: Dg Chest 1 View  06/21/2014   CLINICAL DATA:  Acute abdominal pain for 5 hr.  EXAM: CHEST - 1 VIEW  COMPARISON:  Chest radiographs 06/18/2010.  FINDINGS: There are lower lung volumes with mild resulting bibasilar atelectasis. No consolidation, pleural effusion or pneumothorax is seen. The heart size and mediastinal contours are stable. Dilated colon is noted in the upper abdomen with colonic interposition on the right.  IMPRESSION: Low lung volumes with resulting bibasilar atelectasis. Dilated colon.   Electronically Signed   By: [name] M.D.   On: 06/21/2014 19:07   Ct Abdomen Pelvis W Contrast  06/21/2014   CLINICAL DATA:  Abdominal pain and distention.  Constipation.  EXAM: CT ABDOMEN AND PELVIS WITH CONTRAST  TECHNIQUE: Multidetector CT imaging of the abdomen and pelvis was performed using the standard protocol following bolus administration of intravenous contrast.  CONTRAST:  OMNIPAQUE IOHEXOL 300 MG/ML  SOLN  COMPARISON:  None.  FINDINGS: The abdominal parenchymal organs are normal in appearance except for a few tiny renal  cysts. No evidence hydronephrosis. No soft tissue masses or lymphadenopathy identified within the abdomen or pelvis.  Large amount of stool seen within the colon. The sigmoid colon is dilated, with appearance of closed loop obstruction inferiorly in the pelvis and inflammatory stranding throughout the sigmoid mesocolon. This is consistent with sigmoid volvulus. There is no evidence of pneumatosis or portal venous gas. Mild ascites noted in the right abdomen.  IMPRESSION: Findings consistent with sigmoid volvulus.  Mild ascites.  No evidence of pneumatosis or free air.   Electronically Signed   By: Myles Rosenthal M.D.   On: 06/21/2014 23:22   Dg Abd 2 Views  06/22/2014   CLINICAL DATA:  Abdominal pain.  EXAM: ABDOMEN - 2 VIEW  COMPARISON:  June 21, 2014.  FINDINGS: No abnormal bowel dilatation is  noted. Stool is noted throughout the colon. Dilated bowel noted on prior exam appears to have resolved. Residual contrast is noted in the urinary bladder. There is no evidence of free air. No radio-opaque calculi or other significant radiographic abnormality is seen.  IMPRESSION: Dilated large bowel noted on prior exam is significantly improved. Large amount of stool is now seen throughout the colon concerning for possible constipation.   Electronically Signed   By: Roque LiasJames  Green M.D.   On: 06/22/2014 09:04   Dg Abd 2 Views  06/21/2014   CLINICAL DATA:  Acute abdominal pain for 5 hr.  EXAM: ABDOMEN - 2 VIEW  COMPARISON:  None.  FINDINGS: Massive gaseous distended colon, transverse colon is at least 10 cm with large amount of retained large bowel stool. Small bowel air-fluid levels. No intra-abdominal mass effect. No definite free air though limited assessment due to at colonic air distention. Soft tissue planes and included osseous structures are nonsuspicious.  IMPRESSION: Massively dilated colon with a large amount of large bowel stool, in addition to scattered small bowel air-fluid level suggests ileus or possible distal  large bowel obstruction. Recommend short-term follow-up to verify improvement.  Per included comments, ordering clinician aware findings.   Electronically Signed   By: Awilda Metroourtnay  Bloomer   On: 06/21/2014 19:28     Microbiology: No results found for this or any previous visit (from the past 240 hour(s)).   Labs: Basic Metabolic Panel:  Recent Labs Lab 06/21/14 1846 06/21/14 2205 06/22/14 0830 06/23/14 0352  NA 139 140  --  141  K 3.6 3.8  --  3.6*  CL 101 101  --  103  CO2 28 26  --  26  GLUCOSE 121* 136*  --  92  BUN 13 13  --  11  CREATININE 0.90 0.80 0.92 0.85  CALCIUM 9.9 9.7  --  8.8  MG  --   --   --  2.2   Liver Function Tests:  Recent Labs Lab 06/21/14 1846 06/21/14 2205  AST 28 31  ALT 28 27  ALKPHOS 49 49  BILITOT 0.8 0.6  PROT 7.7 7.4  ALBUMIN 4.7 4.1   No results found for this basename: LIPASE, AMYLASE,  in the last 168 hours No results found for this basename: AMMONIA,  in the last 168 hours CBC:  Recent Labs Lab 06/21/14 1846 06/21/14 2205 06/22/14 0830 06/23/14 0352  WBC 10.3 9.6 9.0 6.0  NEUTROABS 8.8* 8.2*  --   --   HGB 14.9 14.4 13.2 13.4  HCT 43.2 41.3 39.3 39.9  MCV 85.0 86.8 87.9 89.1  PLT 215 191 186 170   Cardiac Enzymes: No results found for this basename: CKTOTAL, CKMB, CKMBINDEX, TROPONINI,  in the last 168 hours BNP: No components found with this basename: POCBNP,  CBG: No results found for this basename: GLUCAP,  in the last 168 hours  Time coordinating discharge:  Greater than 30 minutes  Signed:  Yarelis Ambrosino, DO [facility] Pager: [phone] 06/23/2014, 5:59 PM

## 2014-06-23 NOTE — Progress Notes (Signed)
[facility] Progress Note     Subjective: Pending CSP today.  No N/V or abdominal pain, had several more BM's.  Ambulating well.    Objective: Vital signs in last 24 hours: Temp:  [97.8 F (36.6 C)-98.6 F (37 C)] 98.1 F (36.7 C) (07/09 0502) Pulse Rate:  [60-96] 76 (07/09 0502) Resp:  [17-18] 17 (07/09 0502) BP: (113-148)/(75-91) 134/85 mmHg (07/09 0502) SpO2:  [95 %-96 %] 95 % (07/09 0502) Weight:  [185 lb 9.6 oz (84.188 kg)] 185 lb 9.6 oz (84.188 kg) (07/08 2147) Last BM Date: 06/23/14  Intake/Output from previous day: 07/08 0701 - 07/09 0700 In: 2088.8 [P.O.:600; I.V.:1488.8] Out: 200 [Urine:200] Intake/Output this shift:    PE: Gen:  Alert, NAD, pleasant Abd: soft, mild distension, NT, +BS, no masses, hernias, or organomegaly, no scars noted   Lab Results:   Recent Labs  06/22/14 0830 06/23/14 0352  WBC 9.0 6.0  HGB 13.2 13.4  HCT 39.3 39.9  PLT 186 170   BMET  Recent Labs  06/21/14 2205 06/22/14 0830 06/23/14 0352  NA 140  --  141  K 3.8  --  3.6*  CL 101  --  103  CO2 26  --  26  GLUCOSE 136*  --  92  BUN 13  --  11  CREATININE 0.80 0.92 0.85  CALCIUM 9.7  --  8.8   PT/INR No results found for this basename: LABPROT, INR,  in the last 72 hours CMP     Component Value Date/Time   NA 141 06/23/2014 0352   K 3.6* 06/23/2014 0352   CL 103 06/23/2014 0352   CO2 26 06/23/2014 0352   GLUCOSE 92 06/23/2014 0352   BUN 11 06/23/2014 0352   CREATININE 0.85 06/23/2014 0352   CREATININE 0.90 06/21/2014 1846   CALCIUM 8.8 06/23/2014 0352   PROT 7.4 06/21/2014 2205   ALBUMIN 4.1 06/21/2014 2205   AST 31 06/21/2014 2205   ALT 27 06/21/2014 2205   ALKPHOS 49 06/21/2014 2205   BILITOT 0.6 06/21/2014 2205   GFRNONAA >90 06/23/2014 0352   GFRNONAA >89 05/20/2014 1712   GFRAA >90 06/23/2014 0352   GFRAA >89 05/20/2014 1712   Lipase  No results found for this basename: lipase       Studies/Results: Dg Chest 1 View  06/21/2014   CLINICAL DATA:  Acute abdominal pain  for 5 hr.  EXAM: CHEST - 1 VIEW  COMPARISON:  Chest radiographs 06/18/2010.  FINDINGS: There are lower lung volumes with mild resulting bibasilar atelectasis. No consolidation, pleural effusion or pneumothorax is seen. The heart size and mediastinal contours are stable. Dilated colon is noted in the upper abdomen with colonic interposition on the right.  IMPRESSION: Low lung volumes with resulting bibasilar atelectasis. Dilated colon.   Electronically Signed   By: Roxy Horseman M.D.   On: 06/21/2014 19:07   Ct Abdomen Pelvis W Contrast  06/21/2014   CLINICAL DATA:  Abdominal pain and distention.  Constipation.  EXAM: CT ABDOMEN AND PELVIS WITH CONTRAST  TECHNIQUE: Multidetector CT imaging of the abdomen and pelvis was performed using the standard protocol following bolus administration of intravenous contrast.  CONTRAST:  OMNIPAQUE IOHEXOL 300 MG/ML  SOLN  COMPARISON:  None.  FINDINGS: The abdominal parenchymal organs are normal in appearance except for a few tiny renal cysts. No evidence hydronephrosis. No soft tissue masses or lymphadenopathy identified within the abdomen or pelvis.  Large amount of stool seen within the colon. The  sigmoid colon is dilated, with appearance of closed loop obstruction inferiorly in the pelvis and inflammatory stranding throughout the sigmoid mesocolon. This is consistent with sigmoid volvulus. There is no evidence of pneumatosis or portal venous gas. Mild ascites noted in the right abdomen.  IMPRESSION: Findings consistent with sigmoid volvulus.  Mild ascites.  No evidence of pneumatosis or free air.   Electronically Signed   By: Myles RosenthalJohn  Stahl M.D.   On: 06/21/2014 23:22   Dg Abd 2 Views  06/22/2014   CLINICAL DATA:  Abdominal pain.  EXAM: ABDOMEN - 2 VIEW  COMPARISON:  June 21, 2014.  FINDINGS: No abnormal bowel dilatation is noted. Stool is noted throughout the colon. Dilated bowel noted on prior exam appears to have resolved. Residual contrast is noted in the urinary  bladder. There is no evidence of free air. No radio-opaque calculi or other significant radiographic abnormality is seen.  IMPRESSION: Dilated large bowel noted on prior exam is significantly improved. Large amount of stool is now seen throughout the colon concerning for possible constipation.   Electronically Signed   By: Roque LiasJames  Green M.D.   On: 06/22/2014 09:04   Dg Abd 2 Views  06/21/2014   CLINICAL DATA:  Acute abdominal pain for 5 hr.  EXAM: ABDOMEN - 2 VIEW  COMPARISON:  None.  FINDINGS: Massive gaseous distended colon, transverse colon is at least 10 cm with large amount of retained large bowel stool. Small bowel air-fluid levels. No intra-abdominal mass effect. No definite free air though limited assessment due to at colonic air distention. Soft tissue planes and included osseous structures are nonsuspicious.  IMPRESSION: Massively dilated colon with a large amount of large bowel stool, in addition to scattered small bowel air-fluid level suggests ileus or possible distal large bowel obstruction. Recommend short-term follow-up to verify improvement.  Per included comments, ordering clinician aware findings.   Electronically Signed   By: Awilda Metroourtnay  Bloomer   On: 06/21/2014 19:28    Anti-infectives: Anti-infectives   None       Assessment/Plan Sigmoid volvulus - with spontaneous reduction   Plan:  1. Does not need surgical interventions at this time. His pain is 0/10 currently and his distension improved. Labs are normal. He has had several large bowel movements (6).  2. May at some point need surgery if this were to happen again. He can follow up with us in the office for this discussion - will have him see Dr. Maisie Fushomas.  3. Tolerating clears well, may be able to advance diet, continue enemas  4. SCD's  5. Ambulate and IS  6. Getting colonoscopy today, will await results 7. Discussed at length prevention of constipation, will likely need good bowel regimen once discharged. Will have  dietitian see the patient and give diet modifications.     LOS: 2 days    Aris GeorgiaDORT, Arloa Prak 06/23/2014, 9:16 AM Pager: [phone]

## 2014-06-23 NOTE — Progress Notes (Signed)
Will follow-up on colonoscopy.  Wilmon ArmsMatthew K. Corliss Skainssuei, MD, [facility]  General/ Trauma Surgery  06/23/2014 10:49 AM

## 2014-06-23 NOTE — Progress Notes (Signed)
Patient ID: [name]   DOB: Dec 15, 1951, 63 y.o.   MRN: [id number]  Colonoscopy shows only redundant colon Would advance diet and discharge patient.  He may follow-up with Dr. Romie LeveeAlicia Thomas, [facility] in a few weeks to discuss possible elective resection.  Wilmon ArmsMatthew K. Corliss Skainssuei, MD, [facility]  General/ Trauma Surgery  06/23/2014 5:06 PM

## 2014-06-23 NOTE — Interval H&P Note (Signed)
History and Physical Interval Note:  06/23/2014 12:23 PM  [name]  has presented today for surgery, with the diagnosis of h/o precancerous polyps, recent sigmoid volvulus  The various methods of treatment have been discussed with the patient and family. After consideration of risks, benefits and other options for treatment, the patient has consented to  Procedure(s): COLONOSCOPY (N/A) as a surgical intervention .  The patient's history has been reviewed, patient examined, no change in status, stable for surgery.  I have reviewed the patient's chart and labs.  Questions were answered to the patient's satisfaction.     Rachael FeeJacobs, Kymberlyn Eckford P

## 2014-06-23 NOTE — Progress Notes (Addendum)
PROGRESS NOTE  [name] ZOX:[id number] DOB: [birth year] DOA: 06/21/2014 PCP: No PCP Per Patient  Assessment/Plan: Sigmoid volvulus  -clinically improved-->passing flatus with improving dilatation on abdominal xray  -Multiple bowel movements with enemas -appreciate general surgery  -appreciate GI followup  -colonoscopy plans noted for 06/23/14  -tap water enemas  -plan advance diet after colonoscopy -06/21/2014 CT abdomen and pelvis consistent with sigmoid volvulus  -06/22/2014 2 view abdominal x-ray shows improving dilated small bowel  Hypertension  -Remained stable off of antihypertensive medications  -Restart lisinopril and amlodipine after colonoscopy if stable Hyperlipidemia  -Restart statin when patient is stable  Constipation  -Recommendations per GI -bowel regimen after d/c   Family Communication:   Significant other at beside Disposition Plan:   Home when cleared by GI       Procedures/Studies: Dg Chest 1 View  06/21/2014   CLINICAL DATA:  Acute abdominal pain for 5 hr.  EXAM: CHEST - 1 VIEW  COMPARISON:  Chest radiographs 06/18/2010.  FINDINGS: There are lower lung volumes with mild resulting bibasilar atelectasis. No consolidation, pleural effusion or pneumothorax is seen. The heart size and mediastinal contours are stable. Dilated colon is noted in the upper abdomen with colonic interposition on the right.  IMPRESSION: Low lung volumes with resulting bibasilar atelectasis. Dilated colon.   Electronically Signed   By: Roxy Horseman M.D.   On: 06/21/2014 19:07   Ct Abdomen Pelvis W Contrast  06/21/2014   CLINICAL DATA:  Abdominal pain and distention.  Constipation.  EXAM: CT ABDOMEN AND PELVIS WITH CONTRAST  TECHNIQUE: Multidetector CT imaging of the abdomen and pelvis was performed using the standard protocol following bolus administration of intravenous contrast.  CONTRAST:  OMNIPAQUE IOHEXOL 300 MG/ML  SOLN  COMPARISON:  None.  FINDINGS: The  abdominal parenchymal organs are normal in appearance except for a few tiny renal cysts. No evidence hydronephrosis. No soft tissue masses or lymphadenopathy identified within the abdomen or pelvis.  Large amount of stool seen within the colon. The sigmoid colon is dilated, with appearance of closed loop obstruction inferiorly in the pelvis and inflammatory stranding throughout the sigmoid mesocolon. This is consistent with sigmoid volvulus. There is no evidence of pneumatosis or portal venous gas. Mild ascites noted in the right abdomen.  IMPRESSION: Findings consistent with sigmoid volvulus.  Mild ascites.  No evidence of pneumatosis or free air.   Electronically Signed   By: Myles Rosenthal M.D.   On: 06/21/2014 23:22   Dg Abd 2 Views  06/22/2014   CLINICAL DATA:  Abdominal pain.  EXAM: ABDOMEN - 2 VIEW  COMPARISON:  June 21, 2014.  FINDINGS: No abnormal bowel dilatation is noted. Stool is noted throughout the colon. Dilated bowel noted on prior exam appears to have resolved. Residual contrast is noted in the urinary bladder. There is no evidence of free air. No radio-opaque calculi or other significant radiographic abnormality is seen.  IMPRESSION: Dilated large bowel noted on prior exam is significantly improved. Large amount of stool is now seen throughout the colon concerning for possible constipation.   Electronically Signed   By: Roque Lias M.D.   On: 06/22/2014 09:04   Dg Abd 2 Views  06/21/2014   CLINICAL DATA:  Acute abdominal pain for 5 hr.  EXAM: ABDOMEN - 2 VIEW  COMPARISON:  None.  FINDINGS: Massive gaseous distended colon, transverse colon is at least 10 cm with large amount of retained large bowel  stool. Small bowel air-fluid levels. No intra-abdominal mass effect. No definite free air though limited assessment due to at colonic air distention. Soft tissue planes and included osseous structures are nonsuspicious.  IMPRESSION: Massively dilated colon with a large amount of large bowel stool, in  addition to scattered small bowel air-fluid level suggests ileus or possible distal large bowel obstruction. Recommend short-term follow-up to verify improvement.  Per included comments, ordering clinician aware findings.   Electronically Signed   By: Awilda Metro   On: 06/21/2014 19:28         Subjective: Patient denies fevers, chills, headache, chest pain, dyspnea, nausea, vomiting, diarrhea, abdominal pain, dysuria, hematuria   Objective: Filed Vitals:   06/22/14 1805 06/22/14 2147 06/23/14 0502 06/23/14 1003  BP: 133/82 113/75 134/85 137/84  Pulse: 93 60 76 78  Temp: 98.6 F (37 C) 97.8 F (36.6 C) 98.1 F (36.7 C) 98.7 F (37.1 C)  TempSrc: Oral Oral Oral Oral  Resp: 18 17 17 18   Height:      Weight:  84.188 kg (185 lb 9.6 oz)    SpO2: 96% 96% 95% 95%    Intake/Output Summary (Last 24 hours) at 06/23/14 1118 Last data filed at 06/23/14 0900  Gross per 24 hour  Intake 1848.75 ml  Output    200 ml  Net 1648.75 ml   Weight change: 1.633 kg (3 lb 9.6 oz) Exam:   General:  Pt is alert, follows commands appropriately, not in acute distress  HEENT: No icterus, No thrush,  [place]/AT  Cardiovascular: RRR, S1/S2, no rubs, no gallops  Respiratory: CTA bilaterally, no wheezing, no crackles, no rhonchi  Abdomen: Soft/+BS, non tender, non distended, no guarding  Extremities: No edema, No lymphangitis, No petechiae, No rashes, no synovitis  Data Reviewed: Basic Metabolic Panel:  Recent Labs Lab 06/21/14 1846 06/21/14 2205 06/22/14 0830 06/23/14 0352  NA 139 140  --  141  K 3.6 3.8  --  3.6*  CL 101 101  --  103  CO2 28 26  --  26  GLUCOSE 121* 136*  --  92  BUN 13 13  --  11  CREATININE 0.90 0.80 0.92 0.85  CALCIUM 9.9 9.7  --  8.8  MG  --   --   --  2.2   Liver Function Tests:  Recent Labs Lab 06/21/14 1846 06/21/14 2205  AST 28 31  ALT 28 27  ALKPHOS 49 49  BILITOT 0.8 0.6  PROT 7.7 7.4  ALBUMIN 4.7 4.1   No results found for this basename:  LIPASE, AMYLASE,  in the last 168 hours No results found for this basename: AMMONIA,  in the last 168 hours CBC:  Recent Labs Lab 06/21/14 1846 06/21/14 2205 06/22/14 0830 06/23/14 0352  WBC 10.3 9.6 9.0 6.0  NEUTROABS 8.8* 8.2*  --   --   HGB 14.9 14.4 13.2 13.4  HCT 43.2 41.3 39.3 39.9  MCV 85.0 86.8 87.9 89.1  PLT 215 191 186 170   Cardiac Enzymes: No results found for this basename: CKTOTAL, CKMB, CKMBINDEX, TROPONINI,  in the last 168 hours BNP: No components found with this basename: POCBNP,  CBG: No results found for this basename: GLUCAP,  in the last 168 hours  No results found for this or any previous visit (from the past 240 hour(s)).   Scheduled Meds:  Continuous Infusions: . sodium chloride Stopped (06/23/14 0535)  . sodium chloride 20 mL/hr at 06/23/14 0538     Wil Slape,  Melisia Leming, DO  [facility] Pager [phone]  If 7PM-7AM, please contact night-coverage [email or web address] Password [email or web address] 06/23/2014, 11:18 AM   LOS: 2 days

## 2014-06-23 NOTE — Op Note (Addendum)
[facility] [address] [place], [id number]   COLONOSCOPY PROCEDURE REPORT  PATIENT: Joseph LutesSorbito, Joseph G.  MR#: [id number] BIRTHDATE: 1951/06/10 , 62  yrs. old GENDER: Male ENDOSCOPIST: Rachael Feeaniel P Jacobs, MD PROCEDURE DATE:  06/23/2014 PROCEDURE:   Colonoscopy, diagnostic First Screening Colonoscopy - Avg.  risk and is 50 yrs.  old or older - No.  Prior Negative Screening - Now for repeat screening. N/A  History of Adenoma - Now for follow-up colonoscopy & has been > or = to 3 yrs.  N/A  Polyps Removed Today? No.  Recommend repeat exam, <10 yrs? No. ASA CLASS:   Class II INDICATIONS:h/o adenomatous polyps, recent sigmoid volvulus. MEDICATIONS: Fentanyl 100 mcg IV and Versed 5 mg IV  DESCRIPTION OF PROCEDURE:   After the risks benefits and alternatives of the procedure were thoroughly explained, informed consent was obtained.  A digital rectal exam revealed no abnormalities of the rectum.   The Pentax Adult Colon [id number] endoscope was introduced through the anus and advanced to the cecum, which was identified by both the appendix and ileocecal valve. No adverse events experienced.   The quality of the prep was good.  The instrument was then slowly withdrawn as the colon was fully examined.   COLON FINDINGS: The colon was long and redundant but was otherwise normal.  Normal retroflex views. The time to cecum=6 minutes 00 seconds.  Withdrawal time=8 minutes 00 seconds.  The scope was withdrawn and the procedure completed. COMPLICATIONS: There were no complications.  ENDOSCOPIC IMPRESSION: Long and redundant colon, that was otherwise normal. No polyps or cancers  RECOMMENDATIONS: Consider elective sigmoid pexy or resection to reduce chances of recurrent volvulus.   I recommended that he pursue measures to keep from becoming constipated for now.  If he has volvulus a second time, I would recommend surgery.  eSigned:  Rachael Feeaniel P Jacobs, MD 06/23/2014  1:38 PM Revised: 06/23/2014 1:38 PM

## 2014-06-23 NOTE — H&P (View-Only) (Signed)
Daily Rounding Note  06/22/2014, 10:03 AM  LOS: 1 day   SUBJECTIVE:       Feels better, passed a lot of gas and belly now flat and pain free.  No nausea.   No stools, just gas  OBJECTIVE:         Vital signs in last 24 hours:    Temp:  [98 F (36.7 C)-98.7 F (37.1 C)] 98.7 F (37.1 C) (07/08 0520) Pulse Rate:  [84-103] 84 (07/08 0520) Resp:  [18-20] 18 (07/08 0520) BP: (144-174)/(85-105) 147/85 mmHg (07/08 0520) SpO2:  [96 %-98 %] 96 % (07/08 0520) Weight:  [82.555 kg (182 lb)-85.458 kg (188 lb 6.4 oz)] 84.097 kg (185 lb 6.4 oz) (07/08 0520) Last BM Date: 06/15/14 General: comfortable, not acutely ill   Heart: RRR Chest: clear bil.  No dyspnea Abdomen: soft, hyperactive BS  Extremities: no pedal edema Neuro/Psych:  Pleasant, oriented x 3.  Alert, no gross deficits.   Intake/Output from previous day:    Intake/Output this shift:    Lab Results:  Recent Labs  06/21/14 2205 06/22/14 0830  WBC 9.6 9.0  HGB 14.4 13.2  HCT 41.3 39.3  PLT 191 186   BMET  Recent Labs  06/21/14 2205 06/22/14 0830  NA 140  --   K 3.8  --   CL 101  --   CO2 26  --   GLUCOSE 136*  --   BUN 13  --   CREATININE 0.80 0.92  CALCIUM 9.7  --    LFT  Recent Labs  06/21/14 2205  PROT 7.4  ALBUMIN 4.1  AST 31  ALT 27  ALKPHOS 49  BILITOT 0.6   PT/INR No results found for this basename: LABPROT, INR,  in the last 72 hours Hepatitis Panel No results found for this basename: HEPBSAG, HCVAB, HEPAIGM, HEPBIGM,  in the last 72 hours  Studies/Results: Dg Chest 1 View  06/21/2014   CLINICAL DATA:  Acute abdominal pain for 5 hr.  EXAM: CHEST - 1 VIEW  COMPARISON:  Chest radiographs 06/18/2010.  FINDINGS: There are lower lung volumes with mild resulting bibasilar atelectasis. No consolidation, pleural effusion or pneumothorax is seen. The heart size and mediastinal contours are stable. Dilated colon is noted in the upper  abdomen with colonic interposition on the right.  IMPRESSION: Low lung volumes with resulting bibasilar atelectasis. Dilated colon.   Electronically Signed   By: Roxy HorsemanBill  Veazey M.D.   On: 06/21/2014 19:07   Ct Abdomen Pelvis W Contrast  06/21/2014   CLINICAL DATA:  Abdominal pain and distention.  Constipation.  EXAM: CT ABDOMEN AND PELVIS WITH CONTRAST  TECHNIQUE: Multidetector CT imaging of the abdomen and pelvis was performed using the standard protocol following bolus administration of intravenous contrast.  CONTRAST:  100mL OMNIPAQUE IOHEXOL 300 MG/ML  SOLN  COMPARISON:  None.  FINDINGS: The abdominal parenchymal organs are normal in appearance except for a few tiny renal cysts. No evidence hydronephrosis. No soft tissue masses or lymphadenopathy identified within the abdomen or pelvis.  Large amount of stool seen within the colon. The sigmoid colon is dilated, with appearance of closed loop obstruction inferiorly in the pelvis and inflammatory stranding throughout the sigmoid mesocolon. This is consistent with sigmoid volvulus. There is no evidence of pneumatosis or portal venous gas. Mild ascites noted in the right abdomen.  IMPRESSION: Findings consistent with sigmoid volvulus.  Mild ascites.  No evidence of pneumatosis or free air.  Electronically Signed   By: Myles RosenthalJohn  Stahl M.D.   On: 06/21/2014 23:22   Dg Abd 2 Views  06/22/2014   CLINICAL DATA:  Abdominal pain.  EXAM: ABDOMEN - 2 VIEW  COMPARISON:  June 21, 2014.  FINDINGS: No abnormal bowel dilatation is noted. Stool is noted throughout the colon. Dilated bowel noted on prior exam appears to have resolved. Residual contrast is noted in the urinary bladder. There is no evidence of free air. No radio-opaque calculi or other significant radiographic abnormality is seen.  IMPRESSION: Dilated large bowel noted on prior exam is significantly improved. Large amount of stool is now seen throughout the colon concerning for possible constipation.   Electronically  Signed   By: Roque LiasJames  Green M.D.   On: 06/22/2014 09:04   Dg Abd 2 Views 06/21/2014   CLINICAL DATA:  Acute abdominal pain for 5 hr.  EXAM: ABDOMEN - 2 VIEW  COMPARISON:  None.  FINDINGS: Massive gaseous distended colon, transverse colon is at least 10 cm with large amount of retained large bowel stool. Small bowel air-fluid levels. No intra-abdominal mass effect. No definite free air though limited assessment due to at colonic air distention. Soft tissue planes and included osseous structures are nonsuspicious.  IMPRESSION: Massively dilated colon with a large amount of large bowel stool, in addition to scattered small bowel air-fluid level suggests ileus or possible distal large bowel obstruction. Recommend short-term follow-up to verify improvement.  Per included comments, ordering clinician aware findings.   Electronically Signed   By: Awilda Metroourtnay  Bloomer   On: 06/21/2014 19:28    ASSESMENT:   *  Sigmoid volvulus?.  Resolved with lingering constipation/retained stool.  *  Hyperplastic and adenomatous colon polyps in 2003, colonoscopy by Dr Virginia Rochesterrr.  Subsequent colonoscopy 2009 or 2011 in [place], pt says no polyps on this study which was "ok", apparently had rectal bleeding then.    PLAN   *  Tap water enema.  Clear diet. Miralax *  No plans for colonoscopy today.     Jennye MoccasinSarah Gribbin  06/22/2014, 10:03 AM Pager: [phone]  ________________________________________________________________________  [facility] MD note:  I personally examined the patient, reviewed the data and agree with the assessment and plan described above. Moved his bowels, large amount.  Obviously the volvulous has spontaneously reduced.  We will ask general surgery team to see him while in hosp or to arrange for office visit relatively soon after discharge.  I plan on colonoscopy tomorrow, he has history of precancerous polyps and would like to have repeat examination prior to any potential surgery.  Rob Buntinganiel Jacobs,  MD [facility] Pager [phone]

## 2014-06-23 NOTE — Progress Notes (Signed)
Consent not obtained for scheduled colonoscopy, patient states he was not given information about how the procedure will be and whether he will be sedated or not. Will pass information to morning RN.

## 2014-06-24 ENCOUNTER — Encounter (HOSPITAL_COMMUNITY): Payer: Self-pay | Admitting: Gastroenterology

## 2014-07-11 ENCOUNTER — Ambulatory Visit (INDEPENDENT_AMBULATORY_CARE_PROVIDER_SITE_OTHER): Payer: BC Managed Care – PPO | Admitting: General Surgery

## 2014-07-29 ENCOUNTER — Encounter: Payer: BC Managed Care – PPO | Admitting: Family Medicine

## 2014-09-30 ENCOUNTER — Encounter: Payer: Self-pay | Admitting: Radiology

## 2014-10-19 ENCOUNTER — Ambulatory Visit (INDEPENDENT_AMBULATORY_CARE_PROVIDER_SITE_OTHER): Payer: BC Managed Care – PPO | Admitting: Family Medicine

## 2014-10-19 ENCOUNTER — Encounter: Payer: Self-pay | Admitting: Family Medicine

## 2014-10-19 VITALS — BP 158/92 | HR 80 | Temp 98.7°F | Resp 16 | Ht 68.25 in | Wt 194.0 lb

## 2014-10-19 DIAGNOSIS — R0683 Snoring: Secondary | ICD-10-CM

## 2014-10-19 DIAGNOSIS — R7303 Prediabetes: Secondary | ICD-10-CM

## 2014-10-19 DIAGNOSIS — Z13220 Encounter for screening for lipoid disorders: Secondary | ICD-10-CM

## 2014-10-19 DIAGNOSIS — I10 Essential (primary) hypertension: Secondary | ICD-10-CM

## 2014-10-19 DIAGNOSIS — R7309 Other abnormal glucose: Secondary | ICD-10-CM

## 2014-10-19 DIAGNOSIS — N528 Other male erectile dysfunction: Secondary | ICD-10-CM

## 2014-10-19 DIAGNOSIS — R21 Rash and other nonspecific skin eruption: Secondary | ICD-10-CM

## 2014-10-19 DIAGNOSIS — Z131 Encounter for screening for diabetes mellitus: Secondary | ICD-10-CM

## 2014-10-19 LAB — HEMOGLOBIN A1C
HEMOGLOBIN A1C: 5.9 % — AB (ref <5.7)
Mean Plasma Glucose: 123 mg/dL — ABNORMAL HIGH (ref <117)

## 2014-10-19 LAB — LIPID PANEL
Cholesterol: 160 mg/dL (ref 0–200)
HDL: 60 mg/dL (ref >39)
LDL CALC: 75 mg/dL (ref 0–99)
Total CHOL/HDL Ratio: 2.7 Ratio
Triglycerides: 125 mg/dL (ref <150)
VLDL: 25 mg/dL (ref 0–40)

## 2014-10-19 MED ORDER — AMLODIPINE BESYLATE 10 MG PO TABS: 10.0000 mg | ORAL_TABLET | Freq: Every day | ORAL | Status: DC

## 2014-10-19 MED ORDER — SILDENAFIL CITRATE 100 MG PO TABS: 50.0000 mg | ORAL_TABLET | Freq: Every day | ORAL | Status: DC | PRN

## 2014-10-19 NOTE — Progress Notes (Signed)
[facility] [address], [place] [address] [phone]  Date:  10/19/2014   Name:  Joseph LutesOvido G [name]   DOB:  [birth year]   MRN:  [id number]  PCP:  No PCP Per Patient    Chief Complaint: Hypertension; Headache; Fatigue; and Rash   History of Present Illness:  Joseph [name] is a 63 y.o. very pleasant male patient who presents with the following:  Here today with a few concerns.  Last labs in July of this year.   He is concerned that he may have sleep apnea.  His wife notes that he snores, and that he sometimes will stop breathing during sleep.  He also may feel tired during the day.  He would like to have a sleep study.    He has noted a "rash" on his left and right feet which has been present for about 3 months, worse for 1 month.  It seems to be spreading. It does not hurt but does itch.    He is fasting today He thinks he had a tetanus shot 2 years ago.   He gets his flu shot at work annually.   He would also like to have DM screening  Patient Active Problem List   Diagnosis Date Noted  . 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 Abdominal pain, left lower quadrant 06/22/2014  . Obstipation 06/22/2014  . History of stroke 06/22/2013  . Encounter for long-term (current) use of other medications 10/26/2012  . Hypertension 10/26/2012  . Hyperlipidemia 10/26/2012  . Gout 10/26/2012    Past Medical History  Diagnosis Date  . Stroke   . Hypertension   . Arthritis   . Gout   . ED (erectile dysfunction)   . Dyslipidemia     Past Surgical History  Procedure Laterality Date  . Colonoscopy  multiple  . Colonoscopy N/A 06/23/2014    Procedure: COLONOSCOPY;  Surgeon: Rachael Feeaniel P Jacobs, MD;  [facility];  Service: Endoscopy;  Laterality: N/A;    History  Substance Use Topics  . Smoking status: Never Smoker   . Smokeless tobacco: Not on file  . Alcohol Use: No    Family History  Problem Relation Age of Onset  . Stroke Father     No Known  Allergies  Medication list has been reviewed and updated.  Current Outpatient Prescriptions on File Prior to Visit  Medication Sig Dispense Refill  . amLODipine (NORVASC) 5 MG tablet Take 1 tablet (5 mg total) by mouth daily. 90 tablet 3  . lisinopril (PRINIVIL,ZESTRIL) 20 MG tablet Take 2 tablets (40 mg total) by mouth daily. 180 tablet 3  . pravastatin (PRAVACHOL) 40 MG tablet Take 1 tablet (40 mg total) by mouth at bedtime. 90 tablet 1  . colchicine 0.6 MG tablet Take 2 pills once, then take another an hour later 20 tablet 1  . colchicine 0.6 MG tablet Take 0.6-1.2 mg by mouth daily as needed (gout attack). Take 2 tablets at onset of attack, then take 1 additional tablet an hour later     No current facility-administered medications on file prior to visit.    Review of Systems:  As per HPI- otherwise negative.   Physical Examination: Filed Vitals:   10/19/14 0819  BP: 158/92  Pulse: 80  Temp: 98.7 F (37.1 C)  Resp: 16   Filed Vitals:   10/19/14 0819  Height: 5' 8.25" (1.734 m)  Weight: 194 lb (87.998 kg)   Body mass index is 29.27  kg/(m^2). Ideal Body Weight: Weight in (lb) to have BMI = 25: 165.3  GEN: WDWN, NAD, Non-toxic, A & O x 3, overweight, appears well HEENT: Atraumatic, Normocephalic. Neck supple. No masses, No LAD.  Bilateral TM wnl, oropharynx normal.  PEERL,EOMI.   Ears and Nose: No external deformity. CV: RRR, No M/G/R. No JVD. No thrill. No extra heart sounds. PULM: CTA B, no wheezes, crackles, rhonchi. No retractions. No resp. distress. No accessory muscle use. ABD: S, NT, ND. No rebound. No HSM. EXTR: No c/c/e NEURO Normal gait.  PSYCH: Normally interactive. Conversant. Not depressed or anxious appearing.  Calm demeanor.  He has scattered, discrete hyperpigmented macules over both feet and ankles.  Punch bx taken- see separate note  BP Readings from Last 3 Encounters:  10/19/14 158/92  06/23/14 147/87  06/21/14 174/100    Assessment and  Plan: Snoring - Plan: Nocturnal polysomnography (NPSG)  Essential hypertension - Plan: Lipid panel, Hemoglobin A1c, amLODipine (NORVASC) 10 MG tablet  Rash and nonspecific skin eruption - Plan: Dermatology pathology  Screening for hyperlipidemia - Plan: Lipid panel  Screening for diabetes mellitus - Plan: Hemoglobin A1c  Other male erectile dysfunction - Plan: sildenafil (VIAGRA) 100 MG tablet  Referral for sleep study Will increase his dose of norvasc as his BP is incompletely controlled Await skin bx Await other labs He asked for RF of viagra which he has used in the past with success and without SE, he denies any CP.  Did refill for him today  Signed Abbe AmsterdamJessica Nyree Yonker, MD

## 2014-10-19 NOTE — Progress Notes (Signed)
Procedure Note  Skin Biopsy, left anterior lower extremity just superior to ankle  Verbal consent obtain from the patient. Skin cleansed with alcohol pad, then local anesthesia with 1 cc 2% lidocaine with epinephrine. A 2mm punch biopsy was performed and specimen sent for pathology review. Hemostasis achieved with pressure. Bandage applied. Local wound care reviewed and aftercare instructions provided.  Wallis BambergMario Elder Davidian, PA-C [facility] [phone] 10/19/2014 1:56 PM

## 2014-10-19 NOTE — Patient Instructions (Addendum)
  I will set up your sleep study and give you a call. Let me know if you do not hear about this  Your blood pressure is too high.  Please increase your norvasc to 10 mg a day I will be in touch with your biopsy results so we can decide how to treat your foot rash  Biopsy Care After Refer to this sheet in the next few weeks. These instructions provide you with information on caring for yourself after your procedure. Your caregiver may also give you more specific instructions. Your treatment has been planned according to current medical practices, but problems sometimes occur. Call your caregiver if you have any problems or questions after your procedure. If you had a fine needle biopsy, you may have soreness at the biopsy site for 1 to 2 days. If you had an open biopsy, you may have soreness at the biopsy site for 3 to 4 days. HOME CARE INSTRUCTIONS   You may resume normal diet and activities as directed.  Change bandages (dressings) as directed. If your wound was closed with a skin glue (adhesive), it will wear off and begin to peel in 7 days.  Only take over-the-counter or prescription medicines for pain, discomfort, or fever as directed by your caregiver.  Ask your caregiver when you can bathe and get your wound wet. SEEK IMMEDIATE MEDICAL CARE IF:   You have increased bleeding (more than a small spot) from the biopsy site.  You notice redness, swelling, or increasing pain at the biopsy site.  You have pus coming from the biopsy site.  You have a fever.  You notice a bad smell coming from the biopsy site or dressing.  You have a rash, have difficulty breathing, or have any allergic problems. MAKE SURE YOU:   Understand these instructions.  Will watch your condition.  Will get help right away if you are not doing well or get worse. Document Released: 06/21/2005 Document Revised: 02/24/2012 Document Reviewed: 05/30/2011 [facility] Patient Information 2015 [place] [place]. This  information is not intended to replace advice given to you by your health care provider. Make sure you discuss any questions you have with your health care provider.

## 2014-11-07 ENCOUNTER — Telehealth: Payer: Self-pay | Admitting: Family Medicine

## 2014-11-07 DIAGNOSIS — L309 Dermatitis, unspecified: Secondary | ICD-10-CM

## 2014-11-07 MED ORDER — CLOBETASOL PROPIONATE 0.05 % EX CREA: 1.0000 "application " | TOPICAL_CREAM | Freq: Two times a day (BID) | CUTANEOUS | Status: DC

## 2014-11-07 NOTE — Telephone Encounter (Signed)
Called him and explained that his bx result was not conclusive.  Suggested that we try a steroid and if not helpful we can re bx.  He is ok with this plan   Meds ordered this encounter  Medications  . clobetasol cream (TEMOVATE) 0.05 %    Sig: Apply 1 application topically 2 (two) times daily.    Dispense:  30 g    Refill:  0

## 2014-12-02 ENCOUNTER — Other Ambulatory Visit: Payer: Self-pay | Admitting: Family Medicine

## 2014-12-13 ENCOUNTER — Ambulatory Visit (HOSPITAL_BASED_OUTPATIENT_CLINIC_OR_DEPARTMENT_OTHER): Payer: BC Managed Care – PPO | Admitting: Radiology

## 2014-12-13 NOTE — Sleep Study (Unsigned)
Due to personal reason the patient decided to rescheduled the study for a later date.

## 2014-12-25 ENCOUNTER — Inpatient Hospital Stay (HOSPITAL_COMMUNITY)
Admission: EM | Admit: 2014-12-25 | Discharge: 2014-12-31 | DRG: 331 | Disposition: A | Discharge status: Home / Self Care | Payer: BLUE CROSS/BLUE SHIELD | Attending: Surgery | Admitting: Surgery

## 2014-12-25 ENCOUNTER — Encounter (HOSPITAL_COMMUNITY): Payer: Self-pay | Admitting: *Deleted

## 2014-12-25 ENCOUNTER — Encounter (HOSPITAL_COMMUNITY): Admission: EM | Disposition: A | Discharge status: Home / Self Care | Payer: Self-pay | Source: Home / Self Care

## 2014-12-25 ENCOUNTER — Emergency Department (HOSPITAL_COMMUNITY): Payer: BLUE CROSS/BLUE SHIELD

## 2014-12-25 ENCOUNTER — Ambulatory Visit (INDEPENDENT_AMBULATORY_CARE_PROVIDER_SITE_OTHER): Payer: BLUE CROSS/BLUE SHIELD

## 2014-12-25 ENCOUNTER — Ambulatory Visit (INDEPENDENT_AMBULATORY_CARE_PROVIDER_SITE_OTHER): Payer: BLUE CROSS/BLUE SHIELD | Admitting: Family Medicine

## 2014-12-25 VITALS — BP 132/84 | HR 98 | Temp 99.0°F | Resp 16 | Ht 68.5 in | Wt 193.1 lb

## 2014-12-25 DIAGNOSIS — R140 Abdominal distension (gaseous): Secondary | ICD-10-CM

## 2014-12-25 DIAGNOSIS — Z79899 Other long term (current) drug therapy: Secondary | ICD-10-CM

## 2014-12-25 DIAGNOSIS — R1084 Generalized abdominal pain: Secondary | ICD-10-CM | POA: Diagnosis not present

## 2014-12-25 DIAGNOSIS — E785 Hyperlipidemia, unspecified: Secondary | ICD-10-CM | POA: Diagnosis present

## 2014-12-25 DIAGNOSIS — M109 Gout, unspecified: Secondary | ICD-10-CM | POA: Diagnosis present

## 2014-12-25 DIAGNOSIS — K562 Volvulus: Secondary | ICD-10-CM

## 2014-12-25 DIAGNOSIS — R109 Unspecified abdominal pain: Secondary | ICD-10-CM

## 2014-12-25 DIAGNOSIS — I10 Essential (primary) hypertension: Secondary | ICD-10-CM | POA: Diagnosis present

## 2014-12-25 DIAGNOSIS — F419 Anxiety disorder, unspecified: Secondary | ICD-10-CM | POA: Diagnosis present

## 2014-12-25 DIAGNOSIS — I619 Nontraumatic intracerebral hemorrhage, unspecified: Secondary | ICD-10-CM | POA: Diagnosis present

## 2014-12-25 DIAGNOSIS — Z8673 Personal history of transient ischemic attack (TIA), and cerebral infarction without residual deficits: Secondary | ICD-10-CM

## 2014-12-25 DIAGNOSIS — M1990 Unspecified osteoarthritis, unspecified site: Secondary | ICD-10-CM | POA: Diagnosis present

## 2014-12-25 DIAGNOSIS — Z823 Family history of stroke: Secondary | ICD-10-CM

## 2014-12-25 HISTORY — DX: Nontraumatic intracerebral hemorrhage, unspecified: I61.9

## 2014-12-25 HISTORY — PX: COLONOSCOPY: SHX5424

## 2014-12-25 LAB — BASIC METABOLIC PANEL
Anion gap: 8 (ref 5–15)
BUN: 18 mg/dL (ref 6–23)
CO2: 26 mmol/L (ref 19–32)
CREATININE: 0.9 mg/dL (ref 0.50–1.35)
Calcium: 8.9 mg/dL (ref 8.4–10.5)
Chloride: 104 mEq/L (ref 96–112)
GFR calc non Af Amer: 88 mL/min — ABNORMAL LOW (ref >90)
Glucose, Bld: 90 mg/dL (ref 70–99)
Potassium: 3.6 mmol/L (ref 3.5–5.1)
SODIUM: 138 mmol/L (ref 135–145)

## 2014-12-25 LAB — POCT CBC
Granulocyte percent: 73.7 %G (ref 37–80)
HCT, POC: 45.7 % (ref 43.5–53.7)
Hemoglobin: 15.1 g/dL (ref 14.1–18.1)
Lymph, poc: 1.6 (ref 0.6–3.4)
MCH, POC: 29.7 pg (ref 27–31.2)
MCHC: 33.1 g/dL (ref 31.8–35.4)
MCV: 89.6 fL (ref 80–97)
MID (cbc): 0.5 (ref 0–0.9)
MPV: 7.7 fL (ref 0–99.8)
POC Granulocyte: 6.1 (ref 2–6.9)
POC LYMPH PERCENT: 19.7 %L (ref 10–50)
POC MID %: 6.6 %M (ref 0–12)
Platelet Count, POC: 191 10*3/uL (ref 142–424)
RBC: 5.1 M/uL (ref 4.69–6.13)
RDW, POC: 13.3 %
WBC: 8.3 10*3/uL (ref 4.6–10.2)

## 2014-12-25 LAB — POCT URINALYSIS DIPSTICK
Bilirubin, UA: NEGATIVE
Blood, UA: NEGATIVE
Glucose, UA: NEGATIVE
Ketones, UA: NEGATIVE
Leukocytes, UA: NEGATIVE
Nitrite, UA: NEGATIVE
Spec Grav, UA: 1.02
Urobilinogen, UA: 1
pH, UA: 5.5

## 2014-12-25 LAB — CBC WITH DIFFERENTIAL/PLATELET
BASOS PCT: 1 % (ref 0–1)
Basophils Absolute: 0 10*3/uL (ref 0.0–0.1)
EOS ABS: 0.2 10*3/uL (ref 0.0–0.7)
Eosinophils Relative: 2 % (ref 0–5)
HEMATOCRIT: 43.9 % (ref 39.0–52.0)
HEMOGLOBIN: 14.8 g/dL (ref 13.0–17.0)
Lymphocytes Relative: 16 % (ref 12–46)
Lymphs Abs: 1.4 10*3/uL (ref 0.7–4.0)
MCH: 30.1 pg (ref 26.0–34.0)
MCHC: 33.7 g/dL (ref 30.0–36.0)
MCV: 89.4 fL (ref 78.0–100.0)
Monocytes Absolute: 0.8 10*3/uL (ref 0.1–1.0)
Monocytes Relative: 10 % (ref 3–12)
NEUTROS ABS: 6.3 10*3/uL (ref 1.7–7.7)
Neutrophils Relative %: 71 % (ref 43–77)
PLATELETS: 187 10*3/uL (ref 150–400)
RBC: 4.91 MIL/uL (ref 4.22–5.81)
RDW: 12.9 % (ref 11.5–15.5)
WBC: 8.8 10*3/uL (ref 4.0–10.5)

## 2014-12-25 LAB — I-STAT CG4 LACTIC ACID, ED: LACTIC ACID, VENOUS: 0.76 mmol/L (ref 0.5–2.2)

## 2014-12-25 SURGERY — COLONOSCOPY
Anesthesia: Moderate Sedation

## 2014-12-25 MED ORDER — SODIUM CHLORIDE 0.9 % IV SOLN
Freq: Once | INTRAVENOUS | Status: AC
  Administered 2014-12-25: 17:00:00 via INTRAVENOUS

## 2014-12-25 MED ORDER — FENTANYL CITRATE 0.05 MG/ML IJ SOLN
INTRAMUSCULAR | Status: DC | PRN
  Administered 2014-12-25 (×2): 25 ug via INTRAVENOUS

## 2014-12-25 MED ORDER — MIDAZOLAM HCL 10 MG/2ML IJ SOLN
INTRAMUSCULAR | Status: AC
  Filled 2014-12-25: qty 2

## 2014-12-25 MED ORDER — IOHEXOL 300 MG/ML  SOLN
100.0000 mL | Freq: Once | INTRAMUSCULAR | Status: AC | PRN
  Administered 2014-12-25: 100 mL via INTRAVENOUS

## 2014-12-25 MED ORDER — MIDAZOLAM HCL 5 MG/5ML IJ SOLN
INTRAMUSCULAR | Status: DC | PRN
  Administered 2014-12-25 (×2): 2 mg via INTRAVENOUS

## 2014-12-25 MED ORDER — SODIUM CHLORIDE 0.9 % IV SOLN
INTRAVENOUS | Status: DC
  Administered 2014-12-25: 500 mL via INTRAVENOUS

## 2014-12-25 MED ORDER — SODIUM CHLORIDE 0.9 % IV BOLUS (SEPSIS)
1000.0000 mL | Freq: Once | INTRAVENOUS | Status: AC
  Administered 2014-12-25: 1000 mL via INTRAVENOUS

## 2014-12-25 MED ORDER — FENTANYL CITRATE 0.05 MG/ML IJ SOLN
INTRAMUSCULAR | Status: AC
  Filled 2014-12-25: qty 2

## 2014-12-25 MED ORDER — ONDANSETRON HCL 4 MG/2ML IJ SOLN
4.0000 mg | Freq: Once | INTRAMUSCULAR | Status: AC
  Administered 2014-12-25: 4 mg via INTRAVENOUS
  Filled 2014-12-25: qty 2

## 2014-12-25 MED ORDER — SODIUM CHLORIDE 0.9 % IV SOLN: INTRAVENOUS | Status: DC

## 2014-12-25 MED ORDER — MORPHINE SULFATE 4 MG/ML IJ SOLN
4.0000 mg | INTRAMUSCULAR | Status: DC | PRN
Start: 2014-12-25 — End: 2014-12-26
  Administered 2014-12-25: 4 mg via INTRAVENOUS
  Filled 2014-12-25: qty 1

## 2014-12-25 NOTE — Op Note (Signed)
[facility] [address] [place], [id number]   COLONOSCOPY PROCEDURE REPORT     EXAM DATE: 12/25/2014  PATIENT NAME:      Joseph [name], Joseph [name]           MR #:      [id number]  BIRTHDATE:       Aug 15, 1951      VISIT #:     [id number]  ATTENDING:     Wandalee FerdinandSam Asia Dusenbury, MD     STATUS:     outpatient REFERRING MD: ASA CLASS:         2  INDICATIONS:  The patient is a 64 yr old male here for a colonoscopy due to  sigmoid volvulus PROCEDURE PERFORMED:      colonoscopy to transverse colon with decompression of sigmoid volvulus MEDICATIONS:      fentanyl 50 [name] IV , Versed 4 mg IV ESTIMATED BLOOD LOSS:     None  CONSENT: The patient understands the risks and benefits of the procedure and understands that these risks include, but are not limited to: sedation, allergic reaction, infection, perforation and/or bleeding. Alternative means of evaluation and treatment include, among others: physical exam, x-rays, and/or surgical intervention. The patient elects to proceed with this endoscopic procedure.  DESCRIPTION OF PROCEDURE: During intra-op preparation period all mechanical & medical equipment was checked for proper function. Hand hygiene and appropriate measures for infection prevention was taken. After the risks, benefits and alternatives of the procedure were thoroughly explained, Informed consent was verified, confirmed and timeout was successfully executed by the treatment team. A digital exam  was normal           The Pentax Ped Colon [id number] endoscope was introduced through the anus and advanced to the  transverse colon     . No adverse events experienced. The prep was  , unprepped     . The instrument was then slowly withdrawn as the colon was fully examined. The scope was then completely withdrawn from the patient and the procedure terminated. WITHDRAWAL TIME:  the scope was advanced initially up the rectum and into the sigmoid colon. the sigmoid seemed  redundant but with advancement of the scope it was easily advanced into the descending colon. I then straightened the scope with hopes of straightening the volvulus. I was then able to advance the scope into the transverse colon. The scope was brought out. No lesions were seen. The air in the colon was suctioned out. The abdominal girth decreased. At the end of the procedure the abdomen was soft and not distended.    ADVERSE EVENTS:      There were no immediate complications.  IMPRESSIONS:      sigmoid volvulus , decompressed  RECOMMENDATIONS:      observe for recurrence.  I would give laxative such as  Mira lax. The patient has discussed with the surgeons about cleaning out the colon and then him having an operation since this is his second recurrence of volvulus. RECALL:  Joseph FerdinandSam Idania Desouza, MD eSigned:  Wandalee FerdinandSam Zamirah Denny, MD 12/25/2014 9:49 PM   cc:  CPT CODES: ICD CODES:  The ICD and CPT codes recommended by this software are interpretations from the data that the clinical staff has captured with the software.  The verification of the translation of this report to the ICD and CPT codes and modifiers is the sole responsibility of the health care institution and practicing physician where this report was generated.  [employer] will not  be held responsible for the validity of the ICD and CPT codes included on this report.  AMA assumes no liability for data contained or not contained herein. CPT is a Designer, television/film set of the [employer].

## 2014-12-25 NOTE — ED Notes (Addendum)
Per ems pt from urgent care- confirmed small bowel obstruction, xray performed. hx of small bowel obstruction, similar episode in  May, but did not have surgery. Abdominal pain x2 days, last bowel movement 3 days ago. Abdominal distension, no tenderness upon palpation. 20 G Right forearm. NS fluids infusing, 300 infused so far.

## 2014-12-25 NOTE — ED Provider Notes (Signed)
CSN: [id number]     Arrival date & time 12/25/14  1648 History   First MD Initiated Contact with Patient 12/25/14 1652     Chief Complaint  Patient presents with  . small bowel obstruction       HPI  Patient presents for evaluation of abdominal pain and constipation. 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 Treated nonoperatively. Underwent colonoscopy. However, this was 24 hours after spontaneous decompression without intervention. Colonoscopy was negative. Was encouraged to follow-up with surgery regarding elective resection. Did not. Has been doing well. Approximate 48 hours ago began feeling distention at decreased flatulence and decreased stool output and increasing abdominal distention. Is not vomiting. Feels bloated distended painful and has intermittent severe cramping abdominal pain.  In urgent care and had plain film x-rays suggest volvulus with marked gaseous distention of the colon.  Past Medical History  Diagnosis Date  . 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 Hypertension   . Arthritis   . Gout   . ED (erectile dysfunction)   . Dyslipidemia    Past Surgical History  Procedure Laterality Date  . Colonoscopy  multiple  . Colonoscopy N/A 06/23/2014    Procedure: COLONOSCOPY;  Surgeon: Rachael Fee, MD;  Location: [facility] ENDOSCOPY;  Service: Endoscopy;  Laterality: N/A;  . Colonoscopy N/A 12/25/2014    Procedure: COLONOSCOPY;  Surgeon: Graylin Shiver, MD;  Location: [facility] ENDOSCOPY;  Service: Endoscopy;  Laterality: N/A;   Family History  Problem Relation Age of Onset  . Stroke Father    History  Substance Use Topics  . Smoking status: Never Smoker   . Smokeless tobacco: Never Used  . Alcohol Use: No    Review of Systems  Constitutional: Negative for fever, chills, diaphoresis, appetite change and fatigue.  HENT: Negative for mouth sores, sore throat and trouble swallowing.   Eyes: Negative for visual disturbance.   Respiratory: Negative for cough, chest tightness, shortness of breath and wheezing.   Cardiovascular: Negative for chest pain.  Gastrointestinal: Positive for abdominal pain, constipation and abdominal distention. Negative for nausea, vomiting and diarrhea.  Endocrine: Negative for polydipsia, polyphagia and polyuria.  Genitourinary: Negative for dysuria, frequency and hematuria.  Musculoskeletal: Negative for gait problem.  Skin: Negative for color change, pallor and rash.  Neurological: Negative for dizziness, syncope, light-headedness and headaches.  Hematological: Does not bruise/bleed easily.  Psychiatric/Behavioral: Negative for behavioral problems and confusion.      Allergies  Review of patient's allergies indicates no known allergies.  Home Medications   Prior to Admission medications   Medication Sig Start Date End Date Taking? Authorizing Provider  clobetasol cream (TEMOVATE) 0.05 % Apply 1 application topically 2 (two) times daily. 11/07/14  Yes Gwenlyn Found Copland, MD  colchicine 0.6 MG tablet Take 0.6-1.2 mg by mouth daily as needed (gout attack). Take 2 tablets at onset of attack, then take 1 additional tablet an hour later   Yes Historical Provider, MD  lisinopril (PRINIVIL,ZESTRIL) 20 MG tablet Take 2 tablets (40 mg total) by mouth daily. 03/25/14  Yes Gwenlyn Found Copland, MD  pravastatin (PRAVACHOL) 40 MG tablet TAKE 1 TABLET BY MOUTH AT BEDTIME 12/05/14  Yes Jessica C Copland, MD  sildenafil (VIAGRA) 100 MG tablet Take 0.5-1 tablets (50-100 mg total) by mouth daily as needed for erectile dysfunction. 10/19/14   Gwenlyn Found Copland, MD   BP 166/91 mmHg  Pulse 83  Temp(Src) 97.6 F (36.4 C) (Oral)  Resp 18  Ht 5\' 8"  (1.727 m)  Wt 191 lb 1.6 oz (86.682 kg)  BMI 29.06 kg/m2  SpO2 99% Physical Exam  Constitutional: He is oriented to person, place, and time. He appears well-developed and well-nourished. No distress.  HENT:  Head: Normocephalic.  Eyes: Conjunctivae are  normal. Pupils are equal, round, and reactive to light. No scleral icterus.  Neck: Normal range of motion. Neck supple. No thyromegaly present.  Cardiovascular: Normal rate and regular rhythm.  Exam reveals no gallop and no friction rub.   No murmur heard. Pulmonary/Chest: Effort normal and breath sounds normal. No respiratory distress. He has no wheezes. He has no rales.  Abdominal: Soft. Bowel sounds are normal. He exhibits no distension. There is no tenderness. There is no rebound.  Marked distention. Hyper tympanic. Borborygmi noted. Not a peritoneal abdomen but diffusely tender.  Musculoskeletal: Normal range of motion.  Neurological: He is alert and oriented to person, place, and time.  Skin: Skin is warm and dry. No rash noted.  Psychiatric: He has a normal mood and affect. His behavior is normal.    ED Course  Procedures (including critical care time) Labs Review Labs Reviewed  BASIC METABOLIC PANEL - Abnormal; Notable for the following:    GFR calc non Af Amer 88 (*)    All other components within normal limits  HEMOGLOBIN A1C - Abnormal; Notable for the following:    Hgb A1c MFr Bld 5.8 (*)    Mean Plasma Glucose 120 (*)    All other components within normal limits  BASIC METABOLIC PANEL - Abnormal; Notable for the following:    Potassium 3.4 (*)    Glucose, Bld 103 (*)    BUN 5 (*)    All other components within normal limits  SURGICAL PCR SCREEN  CBC WITH DIFFERENTIAL  BASIC METABOLIC PANEL  CBC  CBC  I-STAT CG4 LACTIC ACID, ED    Imaging Review No results found.   EKG Interpretation None      MDM   Final diagnoses:  Abdominal pain  Sigmoid volvulus    Patient discussed with hospitalist. Initial consultation made with Dr. Carolynne Edouardoth of general surgery, and Dr.Gamem GI. Plan is with Dr. Michaelle BirksGamma him to the GI lab for endoscopy to attempt decompression    Rolland PorterMark Aynslee Mulhall, MD 12/28/14 [phone]

## 2014-12-25 NOTE — Progress Notes (Signed)
64 yo man with abdominal bloating.  He had this last May and was admitted for obstruction which resolved spontaneously.  He subsequently underwent colonoscopy which was normal.  This time the problem began after eating expired milk two days ago.  He passed gas this am but the pain is still there.  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  No shortness of breath, swelling in feet;  No h/o abdominal surgery  Objective:  NAD Obviously distended abdomen Chest clear Heart: reg, no murmur Abdomen:  High pitched bowel sounds, diffuse tenderness with deep palpation, no masses or HSM  Results for orders placed or performed in visit on 12/25/14  POCT CBC  Result Value Ref Range   WBC 8.3 4.6 - 10.2 K/uL   Lymph, poc 1.6 0.6 - 3.4   POC LYMPH PERCENT 19.7 10 - 50 %L   MID (cbc) 0.5 0 - 0.9   POC MID % 6.6 0 - 12 %M   POC Granulocyte 6.1 2 - 6.9   Granulocyte percent 73.7 37 - 80 %G   RBC 5.10 4.69 - 6.13 M/uL   Hemoglobin 15.1 14.1 - 18.1 g/dL   HCT, POC 16.145.7 09.643.5 - 53.7 %   MCV 89.6 80 - 97 fL   MCH, POC 29.7 27 - 31.2 pg   MCHC 33.1 31.8 - 35.4 g/dL   RDW, POC 04.513.3 %   Platelet Count, POC 191 142 - 424 K/uL   MPV 7.7 0 - 99.8 fL  POCT urinalysis dipstick  Result Value Ref Range   Color, UA yellow    Clarity, UA clear    Glucose, UA neg    Bilirubin, UA neg    Ketones, UA neg    Spec Grav, UA 1.020    Blood, UA neg    pH, UA 5.5    Protein, UA trace    Urobilinogen, UA 1.0    Nitrite, UA neg    Leukocytes, UA Negative    [facility] reading (PRIMARY) by  Dr. Milus GlazierLauenstein:  Marked dilated loops of small bowel without air-fluid levels  Assessment: Small bowel obstruction.  Plan: Refer to ED via ambulance [facility]) is Aref signed, Sheila OatsKurt Diem Pagnotta M.D.

## 2014-12-25 NOTE — H&P (Signed)
PCP:   No PCP Per Patient   Chief Complaint:  abd pain  HPI: 64 yo Joseph [name] with 2 days of worsening generalized abdominal pain with bloating.  No bm in 2 days.  No fevers.  No n/v.  Pt had sigmoid volvulus this past summer, was suppose to f/u with general surgery as outpt for surgical options but did not do so.  Had been doing well until 2 days ago.  Had colonoscopy at that time unrevealing.  Went to urgent care today, very distended sent to ED.  Ct shows sigmoid volvulus again.    Review of Systems:  Positive and negative as per HPI otherwise all other systems are negative  Past Medical History: Past Medical History  Diagnosis Date  . Stroke   . Hypertension   . Arthritis   . Gout   . ED (erectile dysfunction)   . Dyslipidemia    Past Surgical History  Procedure Laterality Date  . Colonoscopy  multiple  . Colonoscopy N/A 06/23/2014    Procedure: COLONOSCOPY;  Surgeon: Rachael Fee, MD;  Location: [facility] ENDOSCOPY;  Service: Endoscopy;  Laterality: N/A;    Medications: Prior to Admission medications   Medication Sig Start Date End Date Taking? Authorizing Provider  clobetasol cream (TEMOVATE) 0.05 % Apply 1 application topically 2 (two) times daily. 11/07/14  Yes Gwenlyn Found Copland, MD  colchicine 0.6 MG tablet Take 0.6-1.2 mg by mouth daily as needed (gout attack). Take 2 tablets at onset of attack, then take 1 additional tablet an hour later   Yes Historical Provider, MD  lisinopril (PRINIVIL,ZESTRIL) 20 MG tablet Take 2 tablets (40 mg total) by mouth daily. 03/25/14  Yes Gwenlyn Found Copland, MD  pravastatin (PRAVACHOL) 40 MG tablet TAKE 1 TABLET BY MOUTH AT BEDTIME Joseph/21/15  Yes Jessica C Copland, MD  sildenafil (VIAGRA) 100 MG tablet Take 0.5-1 tablets (50-100 mg total) by mouth daily as needed for erectile dysfunction. 10/19/14   Pearline Cables, MD    Allergies:  No Known Allergies  Social History:  reports that he has never smoked. He has never used smokeless tobacco. He  reports that he does not drink alcohol or use illicit drugs.  Family History: Family History  Problem Relation Age of Onset  . Stroke Father     Physical Exam: Filed Vitals:   12/25/14 1648 12/25/14 1655 12/25/14 1809 12/25/14 1915  BP:  132/77 150/98 141/81  Pulse:  76 82 79  Temp:  98.7 F (37.1 C)    TempSrc:  Oral    Resp:  16 16   SpO2: 97% 96% 99% 100%   General appearance: alert, cooperative and no distress Head: Normocephalic, without obvious abnormality, atraumatic Eyes: negative Nose: Nares normal. Septum midline. Mucosa normal. No drainage or sinus tenderness. Neck: no JVD and supple, symmetrical, trachea midline Lungs: clear to auscultation bilaterally Heart: regular rate and rhythm, S1, S2 normal, no murmur, click, rub or gallop Abdomen: soft, non-tender; bowel sounds normal; no masses,  no organomegaly Extremities: extremities normal, atraumatic, no cyanosis or edema Pulses: 2+ and symmetric Skin: Skin color, texture, turgor normal. No rashes or lesions Neurologic: Grossly normal   Labs on Admission:   Recent Labs  12/25/14 1720  NA 138  K 3.6  CL 104  CO2 26  GLUCOSE 90  BUN 18  CREATININE 0.90  CALCIUM 8.9    Recent Labs  12/25/14 1528 12/25/14 1720  WBC 8.3 8.8  NEUTROABS  --  6.3  HGB 15.1 14.8  HCT 45.7 43.9  MCV 89.6 89.4  PLT  --  187   Radiological Exams on Admission: Ct Abdomen Pelvis W Contrast  12/25/2014   CLINICAL DATA:  Abdominal pain  EXAM: CT ABDOMEN AND PELVIS WITH CONTRAST  TECHNIQUE: Multidetector CT imaging of the abdomen and pelvis was performed using the standard protocol following bolus administration of intravenous contrast.  CONTRAST:  100mL OMNIPAQUE IOHEXOL 300 MG/ML  SOLN  COMPARISON:  None.  FINDINGS: The lung bases are clear.  The liver demonstrates no focal abnormality. There is no intrahepatic or extrahepatic biliary ductal dilatation. The gallbladder is normal. The spleen demonstrates no focal abnormality. The  kidneys, adrenal glands and pancreas are normal. The bladder is unremarkable.  There is gaseous distention of the sigmoid colon with a relative abrupt transition point involving the mid-distal sigmoid colon. There is a moderate amount of stool in the ascending colon. There is a small amount of fluid adjacent to the anti mesenteric aspect of the sigmoid colon which is located in the right side of the abdomen. There is a normal caliber appendix in the right lower quadrant without periappendiceal inflammatory changes. There is no pneumoperitoneum, pneumatosis, or portal venous gas. There is no lymphadenopathy. There is a small amount of perihepatic and perisplenic fluid.  The abdominal aorta is normal in caliber with atherosclerosis.  There are no lytic or sclerotic osseous lesions. There is grade 1 anterolisthesis of L4 on L5. There is bilateral facet arthropathy at L4-5 and L5-S1.  IMPRESSION: 1. Sigmoid colon dilatation with a relative abrupt transition point involving the mid -distal sigmoid colon most concerning for a sigmoid volvulus. There is a small amount of abdominal free fluid.   Electronically Signed   By: Elige KoHetal  Patel   On: 12/25/2014 18:48   Dg Abd Acute W/chest  12/25/2014   CLINICAL DATA:  Patient with abdominal bloating. History of prior bowel obstruction  EXAM: ACUTE ABDOMEN SERIES (ABDOMEN 2 VIEW & CHEST 1 VIEW)  COMPARISON:  06/22/2014  FINDINGS: Stable cardiac and mediastinal contours with mild tortuosity of the thoracic aorta. Minimal heterogeneous opacities bilateral lung bases, suggestive of atelectasis. No pleural effusion or pneumothorax.  There is suggestion of marked dilatation of the colon. There are scattered loops of prominent small bowel throughout the abdomen. Assessment for free air limited secondary to marked colonic distention. Regional skeleton is unremarkable.  IMPRESSION: There is marked distention of the colon which may be secondary to distal colonic obstruction. Colonic  volvulus is not excluded.  Scattered prominent loops of small bowel.  These results will be called to the ordering clinician or representative by the Radiologist Assistant, and communication documented in the PACS or zVision Dashboard.   Electronically Signed   By: Annia Beltrew  Davis M.D.   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  General surgery also to see tonight per ER doc report.  Lactic acid level nml reassuring.  Npo.  Hold anticoagulants at this time.  Prn dilaudid and zofran.  ivf overnight.  Active Problems:  Stable unless o/w noted.  Hold all meds for now.    Hypertension   Hyperlipidemia   History of stroke  Admit to med surg.  Full code.    Malkie Wille A 12/25/2014, 8:37 PM

## 2014-12-25 NOTE — ED Notes (Signed)
Phlebotomy at bedside.

## 2014-12-25 NOTE — ED Notes (Signed)
Bed: [id number] Expected date: 12/25/14 Expected time: 4:37 PM Means of arrival:  Comments: sbo

## 2014-12-25 NOTE — Consult Note (Signed)
Reason for Consult:abdominal distension Referring Physician: Dr. [name] is an 64 y.o. male.  HPI: The pt is a 64yo hispanic male who presents with abdominal distension and inability to have a bm for the last 2-3 days. He has passed a small amout of flatus. CT shows a sigmoid volvulus. He was in hospital 6 months ago with the same problem. It was able to be opened up with colonoscopy so the pt went home and never followed up with surgery. No nausea or vomiting. No fever or chill.  Past Medical History  Diagnosis Date  . Stroke   . Hypertension   . Arthritis   . Gout   . ED (erectile dysfunction)   . Dyslipidemia     Past Surgical History  Procedure Laterality Date  . Colonoscopy  multiple  . Colonoscopy N/A 06/23/2014    Procedure: COLONOSCOPY;  Surgeon: Joseph Banister, MD;  Location: [place];  Service: Endoscopy;  Laterality: N/A;    Family History  Problem Relation Age of Onset  . Stroke Father     Social History:  reports that he has never smoked. He has never used smokeless tobacco. He reports that he does not drink alcohol or use illicit drugs.  Allergies: No Known Allergies  Medications: I have reviewed the patient'[name] current medications.  Results for orders placed or performed during the hospital encounter of 12/25/14 (from the past 48 hour([name]))  CBC with Differential     Status: None   Collection Time: 12/25/14  5:20 PM  Result Value Ref Range   WBC 8.8 4.0 - 10.5 K/uL   RBC 4.91 4.22 - 5.81 MIL/uL   Hemoglobin 14.8 13.0 - 17.0 g/dL   HCT 43.9 39.0 - 52.0 %   MCV 89.4 78.0 - 100.0 fL   MCH 30.1 26.0 - 34.0 pg   MCHC 33.7 30.0 - 36.0 g/dL   RDW 12.9 11.5 - 15.5 %   Platelets 187 150 - 400 K/uL   Neutrophils Relative % 71 43 - 77 %   Neutro Abs 6.3 1.7 - 7.7 K/uL   Lymphocytes Relative 16 12 - 46 %   Lymphs Abs 1.4 0.7 - 4.0 K/uL   Monocytes Relative 10 3 - 12 %   Monocytes Absolute 0.8 0.1 - 1.0 K/uL   Eosinophils Relative 2 0 - 5 %    Eosinophils Absolute 0.2 0.0 - 0.7 K/uL   Basophils Relative 1 0 - 1 %   Basophils Absolute 0.0 0.0 - 0.1 K/uL  Basic metabolic panel     Status: Abnormal   Collection Time: 12/25/14  5:20 PM  Result Value Ref Range   Sodium 138 135 - 145 mmol/L    Comment: Please note change in reference range.   Potassium 3.6 3.5 - 5.1 mmol/L    Comment: Please note change in reference range.   Chloride 104 96 - 112 mEq/L   CO2 26 19 - 32 mmol/L   Glucose, Bld 90 70 - 99 mg/dL   BUN 18 6 - 23 mg/dL   Creatinine, Ser 0.90 0.50 - 1.35 mg/dL   Calcium 8.9 8.4 - 10.5 mg/dL   GFR calc non Af Amer 88 (L) >90 mL/min   GFR calc Af Amer >90 >90 mL/min    Comment: (NOTE) The eGFR has been calculated using the CKD EPI equation. This calculation has not been validated in all clinical situations. eGFR'[name] persistently <90 mL/min signify possible Chronic Kidney Disease.    Anion  gap 8 5 - 15  I-Stat CG4 Lactic Acid, ED     Status: None   Collection Time: 12/25/14  5:31 PM  Result Value Ref Range   Lactic Acid, Venous 0.76 0.5 - 2.2 mmol/L    Ct Abdomen Pelvis W Contrast  12/25/2014   CLINICAL DATA:  Abdominal pain  EXAM: CT ABDOMEN AND PELVIS WITH CONTRAST  TECHNIQUE: Multidetector CT imaging of the abdomen and pelvis was performed using the standard protocol following bolus administration of intravenous contrast.  CONTRAST:  164m OMNIPAQUE IOHEXOL 300 MG/ML  SOLN  COMPARISON:  None.  FINDINGS: The lung bases are clear.  The liver demonstrates no focal abnormality. There is no intrahepatic or extrahepatic biliary ductal dilatation. The gallbladder is normal. The spleen demonstrates no focal abnormality. The kidneys, adrenal glands and pancreas are normal. The bladder is unremarkable.  There is gaseous distention of the sigmoid colon with a relative abrupt transition point involving the mid-distal sigmoid colon. There is a moderate amount of stool in the ascending colon. There is a small amount of fluid adjacent to  the anti mesenteric aspect of the sigmoid colon which is located in the right side of the abdomen. There is a normal caliber appendix in the right lower quadrant without periappendiceal inflammatory changes. There is no pneumoperitoneum, pneumatosis, or portal venous gas. There is no lymphadenopathy. There is a small amount of perihepatic and perisplenic fluid.  The abdominal aorta is normal in caliber with atherosclerosis.  There are no lytic or sclerotic osseous lesions. There is grade 1 anterolisthesis of L4 on L5. There is bilateral facet arthropathy at L4-5 and L5-S1.  IMPRESSION: 1. Sigmoid colon dilatation with a relative abrupt transition point involving the mid -distal sigmoid colon most concerning for a sigmoid volvulus. There is a small amount of abdominal free fluid.   Electronically Signed   By: HKathreen Devoid  On: 12/25/2014 18:48   Dg Abd Acute W/chest  12/25/2014   CLINICAL DATA:  Patient with abdominal bloating. History of prior bowel obstruction  EXAM: ACUTE ABDOMEN SERIES (ABDOMEN 2 VIEW & CHEST 1 VIEW)  COMPARISON:  06/22/2014  FINDINGS: Stable cardiac and mediastinal contours with mild tortuosity of the thoracic aorta. Minimal heterogeneous opacities bilateral lung bases, suggestive of atelectasis. No pleural effusion or pneumothorax.  There is suggestion of marked dilatation of the colon. There are scattered loops of prominent small bowel throughout the abdomen. Assessment for free air limited secondary to marked colonic distention. Regional skeleton is unremarkable.  IMPRESSION: There is marked distention of the colon which may be secondary to distal colonic obstruction. Colonic volvulus is not excluded.  Scattered prominent loops of small bowel.  These results will be called to the ordering clinician or representative by the Radiologist Assistant, and communication documented in the PACS or zVision Dashboard.   Electronically Signed   By: DLovey NewcomerM.D.   On: 12/25/2014 16:27    Review  of Systems  Constitutional: Negative.   HENT: Negative.   Eyes: Negative.   Respiratory: Negative.   Cardiovascular: Negative.   Gastrointestinal: Positive for abdominal pain and constipation.  Genitourinary: Negative.   Musculoskeletal: Negative.   Skin: Negative.   Neurological: Negative.   Endo/Heme/Allergies: Negative.   Psychiatric/Behavioral: Negative.    Blood pressure 133/84, pulse 79, temperature 98.7 F (37.1 C), temperature source Oral, resp. rate 17, SpO2 100 %. Physical Exam  Constitutional: He is oriented to person, place, and time. He appears well-developed and well-nourished.  HENT:  Head:  Normocephalic and atraumatic.  Eyes: Conjunctivae and EOM are normal. Pupils are equal, round, and reactive to light.  Neck: Normal range of motion. Neck supple.  Cardiovascular: Normal rate, regular rhythm and normal heart sounds.   Respiratory: Effort normal and breath sounds normal.  GI: Soft. He exhibits distension.  There is mild tenderness diffusely but no guarding or peritonitis. There is moderate distension. Abdomen is otherwise soft  Musculoskeletal: Normal range of motion.  Neurological: He is alert and oriented to person, place, and time.  Skin: Skin is warm and dry.  Psychiatric: He has a normal mood and affect. His behavior is normal.    Assessment/Plan: The pt has a sigmoid volvulus but does not appear sick. Agree with attempt to open and reduce endoscopically. If this is successful then we may be able to prep him and resect the volvulus in one stage operation and reconnect. If not then he may require colostomy. Will follow  Joseph [name],Joseph [name] 12/25/2014, 9:22 PM

## 2014-12-25 NOTE — Consult Note (Signed)
Subjective:   HPI  The patient is a 64 year old male with a history of a prior sigmoid volvulus. He presented to the emergency room again with complaints of abdominal distention and pain. A CT scan of the abdomen was done which showed evidence again of a sigmoid volvulus. We were called to see the patient for evaluation and decompression. He has been seen by surgery.  He denies chest pain or shortness of breath  Past Medical History  Diagnosis Date  . Stroke   . Hypertension   . Arthritis   . Gout   . ED (erectile dysfunction)   . Dyslipidemia    Past Surgical History  Procedure Laterality Date  . Colonoscopy  multiple  . Colonoscopy N/A 06/23/2014    Procedure: COLONOSCOPY;  Surgeon: Rachael Feeaniel P Jacobs, MD;  [facility];  Service: Endoscopy;  Laterality: N/A;   History   Social History  . Marital Status: Married    Spouse Name: N/A    Number of Children: N/A  . Years of Education: N/A   Occupational History  . Not on file.   Social History Main Topics  . Smoking status: Never Smoker   . Smokeless tobacco: Never Used  . Alcohol Use: No  . Drug Use: No  . Sexual Activity: Not on file   Other Topics Concern  . Not on file   Social History Narrative   Married, 9 children, 8 in [place] 1 [place]   Works in a [employer] (light work)   No alcohol, drugs, tobacco   In [place] since 1998   family history includes Stroke in his father. Current facility-administered medications: 0.9 %  sodium chloride infusion, , Intravenous, Continuous, Graylin ShiverSalem F Sanjith Siwek, MD, Last Rate: 10 mL/hr at 12/25/14 2109, 500 mL at 12/25/14 2109;  0.9 %  sodium chloride infusion, , Intravenous, Continuous, Graylin ShiverSalem F Josealfredo Adkins, MD;  Mitzi Hansen[MAR Hold] morphine 4 MG/ML injection 4 mg, 4 mg, Intravenous, Q1H PRN, Rolland PorterMark James, MD, 4 mg at 12/25/14 2024 No Known Allergies   Objective:     BP 133/84 mmHg  Pulse 79  Temp(Src) 98.7 F (37.1 C) (Oral)  Resp 17  SpO2 100%  He is in no  distress  Nonicteric  Heart regular rhythm no murmurs  Lungs clear  Abdomen: Distended, tympanitic, mild discomfort to palpation  Laboratory No components found for: D1    Assessment:     Sigmoid volvulus      Plan:     He is being admitted to the hospital. We will attempt colonoscopic decompression this evening. He has been seen by surgery already.

## 2014-12-25 NOTE — ED Notes (Signed)
Bed: [id number] Expected date:  Expected time:  Means of arrival:  Comments: Endoscopy

## 2014-12-26 ENCOUNTER — Encounter (HOSPITAL_COMMUNITY): Payer: Self-pay | Admitting: Gastroenterology

## 2014-12-26 DIAGNOSIS — R1084 Generalized abdominal pain: Secondary | ICD-10-CM | POA: Diagnosis present

## 2014-12-26 DIAGNOSIS — Z823 Family history of stroke: Secondary | ICD-10-CM | POA: Diagnosis not present

## 2014-12-26 DIAGNOSIS — I10 Essential (primary) hypertension: Secondary | ICD-10-CM | POA: Diagnosis present

## 2014-12-26 DIAGNOSIS — E785 Hyperlipidemia, unspecified: Secondary | ICD-10-CM | POA: Diagnosis present

## 2014-12-26 DIAGNOSIS — K562 Volvulus: Secondary | ICD-10-CM | POA: Diagnosis present

## 2014-12-26 DIAGNOSIS — Z8673 Personal history of transient ischemic attack (TIA), and cerebral infarction without residual deficits: Secondary | ICD-10-CM | POA: Diagnosis not present

## 2014-12-26 DIAGNOSIS — I619 Nontraumatic intracerebral hemorrhage, unspecified: Secondary | ICD-10-CM | POA: Diagnosis present

## 2014-12-26 DIAGNOSIS — M1990 Unspecified osteoarthritis, unspecified site: Secondary | ICD-10-CM | POA: Diagnosis present

## 2014-12-26 DIAGNOSIS — M109 Gout, unspecified: Secondary | ICD-10-CM | POA: Diagnosis present

## 2014-12-26 DIAGNOSIS — F419 Anxiety disorder, unspecified: Secondary | ICD-10-CM | POA: Diagnosis present

## 2014-12-26 DIAGNOSIS — Z79899 Other long term (current) drug therapy: Secondary | ICD-10-CM | POA: Diagnosis not present

## 2014-12-26 LAB — BASIC METABOLIC PANEL
Anion gap: 7 (ref 5–15)
BUN: 16 mg/dL (ref 6–23)
CALCIUM: 8.6 mg/dL (ref 8.4–10.5)
CHLORIDE: 109 meq/L (ref 96–112)
CO2: 25 mmol/L (ref 19–32)
Creatinine, Ser: 0.8 mg/dL (ref 0.50–1.35)
Glucose, Bld: 90 mg/dL (ref 70–99)
POTASSIUM: 3.6 mmol/L (ref 3.5–5.1)
Sodium: 141 mmol/L (ref 135–145)

## 2014-12-26 LAB — CBC
HEMATOCRIT: 41.6 % (ref 39.0–52.0)
Hemoglobin: 13.7 g/dL (ref 13.0–17.0)
MCH: 29.7 pg (ref 26.0–34.0)
MCHC: 32.9 g/dL (ref 30.0–36.0)
MCV: 90.2 fL (ref 78.0–100.0)
PLATELETS: 171 10*3/uL (ref 150–400)
RBC: 4.61 MIL/uL (ref 4.22–5.81)
RDW: 13.1 % (ref 11.5–15.5)
WBC: 6.3 10*3/uL (ref 4.0–10.5)

## 2014-12-26 LAB — HEMOGLOBIN A1C
HEMOGLOBIN A1C: 5.8 % — AB (ref <5.7)
Mean Plasma Glucose: 120 mg/dL — ABNORMAL HIGH (ref <117)

## 2014-12-26 LAB — SURGICAL PCR SCREEN
MRSA, PCR: NEGATIVE
Staphylococcus aureus: NEGATIVE

## 2014-12-26 MED ORDER — CHLORHEXIDINE GLUCONATE 4 % EX LIQD
60.0000 mL | Freq: Once | CUTANEOUS | Status: AC
  Administered 2014-12-26: 4 via TOPICAL
  Filled 2014-12-26: qty 60

## 2014-12-26 MED ORDER — METRONIDAZOLE 500 MG PO TABS
1000.0000 mg | ORAL_TABLET | Freq: Three times a day (TID) | ORAL | Status: DC
  Filled 2014-12-26: qty 2

## 2014-12-26 MED ORDER — CHLORHEXIDINE GLUCONATE 4 % EX LIQD
60.0000 mL | Freq: Once | CUTANEOUS | Status: AC
  Administered 2014-12-27: 4 via TOPICAL
  Filled 2014-12-26: qty 60

## 2014-12-26 MED ORDER — POLYETHYLENE GLYCOL 3350 17 GM/SCOOP PO POWD
1.0000 | Freq: Once | ORAL | Status: DC
  Filled 2014-12-26: qty 255

## 2014-12-26 MED ORDER — BISACODYL 5 MG PO TBEC: 20.0000 mg | DELAYED_RELEASE_TABLET | Freq: Once | ORAL | Status: DC

## 2014-12-26 MED ORDER — POTASSIUM CHLORIDE 20 MEQ/15ML (10%) PO SOLN
40.0000 meq | Freq: Two times a day (BID) | ORAL | Status: AC
  Administered 2014-12-26 (×2): 40 meq via ORAL
  Filled 2014-12-26 (×3): qty 30

## 2014-12-26 MED ORDER — POTASSIUM CHLORIDE 2 MEQ/ML IV SOLN
INTRAVENOUS | Status: DC
  Administered 2014-12-26 – 2014-12-29 (×7): via INTRAVENOUS
  Filled 2014-12-26 (×15): qty 1000

## 2014-12-26 MED ORDER — PEG 3350-KCL-NA BICARB-NACL 420 G PO SOLR
2880.0000 mL | Freq: Once | ORAL | Status: AC
  Administered 2014-12-26: 2880 mL via ORAL

## 2014-12-26 MED ORDER — BISACODYL 5 MG PO TBEC
10.0000 mg | DELAYED_RELEASE_TABLET | Freq: Once | ORAL | Status: AC
  Administered 2014-12-26: 10 mg via ORAL
  Filled 2014-12-26: qty 2

## 2014-12-26 MED ORDER — METRONIDAZOLE 500 MG PO TABS
1000.0000 mg | ORAL_TABLET | Freq: Three times a day (TID) | ORAL | Status: AC
  Administered 2014-12-26: 1000 mg via ORAL
  Filled 2014-12-26: qty 2

## 2014-12-26 MED ORDER — ALVIMOPAN 12 MG PO CAPS
12.0000 mg | ORAL_CAPSULE | Freq: Once | ORAL | Status: AC
  Administered 2014-12-27: 12 mg via ORAL
  Filled 2014-12-26: qty 1

## 2014-12-26 MED ORDER — POLYETHYLENE GLYCOL 3350 17 G PO PACK: 17.0000 g | PACK | Freq: Two times a day (BID) | ORAL | Status: DC

## 2014-12-26 MED ORDER — CEFOTETAN DISODIUM 2 G IJ SOLR
2.0000 g | INTRAMUSCULAR | Status: DC
  Filled 2014-12-26: qty 2

## 2014-12-26 MED ORDER — NEOMYCIN SULFATE 500 MG PO TABS
1000.0000 mg | ORAL_TABLET | Freq: Three times a day (TID) | ORAL | Status: DC
  Filled 2014-12-26: qty 2

## 2014-12-26 MED ORDER — POLYETHYLENE GLYCOL 3350 17 G PO PACK
17.0000 g | PACK | Freq: Every day | ORAL | Status: DC
  Filled 2014-12-26: qty 1

## 2014-12-26 MED ORDER — ONDANSETRON HCL 4 MG PO TABS: 4.0000 mg | ORAL_TABLET | Freq: Four times a day (QID) | ORAL | Status: DC | PRN

## 2014-12-26 MED ORDER — ONDANSETRON HCL 4 MG/2ML IJ SOLN
4.0000 mg | Freq: Four times a day (QID) | INTRAMUSCULAR | Status: DC | PRN
Start: 2014-12-26 — End: 2014-12-31
  Filled 2014-12-26: qty 2

## 2014-12-26 MED ORDER — DEXTROSE 5 % IV SOLN
2.0000 g | INTRAVENOUS | Status: AC
  Filled 2014-12-26: qty 2

## 2014-12-26 MED ORDER — NEOMYCIN SULFATE 500 MG PO TABS
1000.0000 mg | ORAL_TABLET | Freq: Three times a day (TID) | ORAL | Status: AC
  Administered 2014-12-26: 1000 mg via ORAL
  Filled 2014-12-26: qty 2

## 2014-12-26 MED ORDER — POTASSIUM CHLORIDE CRYS ER 20 MEQ PO TBCR
40.0000 meq | EXTENDED_RELEASE_TABLET | Freq: Two times a day (BID) | ORAL | Status: AC
  Administered 2014-12-26 (×2): 40 meq via ORAL
  Filled 2014-12-26 (×2): qty 2

## 2014-12-26 MED ORDER — SODIUM CHLORIDE 0.9 % IV SOLN
INTRAVENOUS | Status: AC
  Administered 2014-12-26: 02:00:00 via INTRAVENOUS

## 2014-12-26 MED ORDER — HEPARIN SODIUM (PORCINE) 5000 UNIT/ML IJ SOLN
5000.0000 [IU] | Freq: Once | INTRAMUSCULAR | Status: AC
  Administered 2014-12-26: 5000 [IU] via SUBCUTANEOUS
  Filled 2014-12-26: qty 1

## 2014-12-26 MED ORDER — MORPHINE SULFATE 2 MG/ML IJ SOLN: 2.0000 mg | INTRAMUSCULAR | Status: DC | PRN

## 2014-12-26 NOTE — Assessment & Plan Note (Signed)
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

## 2014-12-26 NOTE — Progress Notes (Signed)
Pt comfortable at this time, and taking a clear liquid diet.  Abd nondistended, slt tympanitic in RUQ, non tender.  K = 3.6  Recomm:  1.  Will order two doses of KCl in view of borderline low K, which might contribute to colonic dysmotility and predispose to recurrent volvulus.  2.  Advised pt that I agree w/ plan for sig colectomy, in view of several episodes of volvulus and his relatively young age.  3.  Will sign off at this time, but would be happy to see pt again at your request if needed.  Florencia Reasonsobert V. Danil Wedge, M.D. [phone]

## 2014-12-26 NOTE — Consult Note (Signed)
WOC ostomy consult note Patient seen today for preoperative stoma site selection per request of Dr. Michaell CowingGross.  Requested two markings:  (temporary loop) ileostomy and (permanent) colostomy. I saw Dr. Michaell CowingGross briefly this morning and he tells me that while the patient is still under consideration and work-up, he is tentatively posted for tomorrow. Procedure:  Patient's abdomen seen in the sitting, standing and lying position.  Abdominal rectus palpated.  Abdomen is soft, with some adipose tissue resulting in a small abdominal roll.  He wears his underwear and slacks low on the abdomen, so even a LLQ marking is above the belt line.  Ileostomy site is marked in the RUQ: 1.0 cm above the umbilicus and 6.8 cm to the right of the umbilicus. Colostomy site is marked in the LLQ: 2.2 cm below the umbilicus and 6.0 cm to the left of the umbilicus. Sites are marked with a surgical skin marking pen and covered with thin film transparent (Tegaderm) dressings. Education provided: Patient is aware of the end result of ostomy surgery; he understands that a bag (or pouch) will be required for the management of his solid waste (stool).  A pouch is provided for his information and he accepts it and askes appropriate questions, but does not wish to keep the pouch.  He asks me this morning if is urine (urination) will be changed and I assure him that he will void in the same manner as he does currently.  He expresses relief at this information.  He understands that ostomy nurses work at this hospital and that we will be here to assist him should surgery result in the creation of an ostomy. Patient tells me that he hopes and prays that this will not be needed. While English is not his primary language, patient easily understands our discussion and is able to express his thoughts in [place].  I make him aware that interpreter services are available if he desires and he declines today. WOC nursing team will remain available to this  patient, the nursing, surgical and medical teams.  Please re-consult if an ostomy is created intraoperatively. Thanks, Ladona MowLaurie Lataja Newland, MSN, RN, GNP, [place], CWON-AP [phone])

## 2014-12-26 NOTE — Progress Notes (Signed)
Patient has completed 2- tap water enemas and half the container of golytely. Stools are not clear. Will continue to prep for surgery in am.

## 2014-12-26 NOTE — Progress Notes (Signed)
[facility]  [address] [place], [place] [address] Phone: [phone] FAX: [place] [phone] 09-18-1951  CARE TEAM:  PCP: No PCP Per Patient  Outpatient Care Team: Patient Care Team: No Pcp Per Patient as PCP - General (General Practice)  Inpatient Treatment Team: Treatment Team: Attending Provider: Charlynne Cousins, MD; Consulting Physician: Md Edison Pace, MD; Rounding Team: Ian Bushman, MD; Technician: Leda Quail, NT; Respiratory Therapist: Gonzella Lex, RRT   Subjective:  Feeling better No events Denies abdominal pain  Objective:  Vital signs:  Filed Vitals:   12/26/14 0000 12/26/14 0045 12/26/14 0148 12/26/14 0630  BP: 150/75 111/72 127/78 133/89  Pulse: 72 71 76 78  Temp:   98.8 F (37.1 C) 98.6 F (37 C)  TempSrc:   Oral Oral  Resp:   18 18  Height:   '5\' 8"'  (1.727 m)   Weight:   191 lb 1.6 oz (86.682 kg)   SpO2: 94% 92% 97% 99%    Last BM Date: 12/23/14  Intake/Output   Yesterday:  01/10 0701 - 01/11 0700 In: 320 [I.V.:320] Out: 300 [Urine:300] This shift:     Bowel function:  Flatus: y  BM: n  Drain: n/a  Physical Exam:  General: Pt awake/alert/oriented x4 in no acute distress Eyes: PERRL, normal EOM.  Sclera clear.  No icterus Neuro: CN II-XII intact w/o focal sensory/motor deficits. Lymph: No head/neck/groin lymphadenopathy Psych:  No delerium/psychosis/paranoia HENT: Normocephalic, Mucus membranes moist.  No thrush Neck: Supple, No tracheal deviation Chest: No chest wall pain w good excursion CV:  Pulses intact.  Regular rhythm MS: Normal AROM mjr joints.  No obvious deformity Abdomen: Soft.  Nondistended.  Nontender.  No evidence of peritonitis.  No incarcerated hernias. Ext:  SCDs BLE.  No mjr edema.  No cyanosis Skin: No petechiae / purpura   Problem List:   Principal Problem:   Sigmoid volvulus Active Problems:   Hypertension    Hyperlipidemia   History of stroke   Assessment  Joseph [name]  64 y.o. male  1 Day Post-Op  Procedure(s): COLONOSCOPY  Recurrent sigmoid volvulus  Plan:  Sigmoid colectomy - would plan this admission since recurrent & ? Compliance with surgical followup last time.  Plan MIS approach.  Robotic vs lap.  Should be able to avoid temp colostomy since now decompressed.  Do bowel prep to min problems.  D/w patient:  The anatomy & physiology of the digestive tract was discussed.  The pathophysiology of the colon was discussed.  Natural history risks without surgery was discussed.   I feel the risks of no intervention will lead to serious problems that outweigh the operative risks; therefore, I recommended a partial colectomy to remove the pathology.  Minimally invasive (Robotic/Laparoscopic) & open techniques were discussed.   Risks such as bleeding, infection, abscess, leak, reoperation, possible ostomy, hernia, heart attack, stroke, death, and other risks were discussed.  I noted a good likelihood this will help address the problem.   Goals of post-operative recovery were discussed as well.   Need for adequate nutrition, daily bowel regimen and healthy physical activity, to optimize recovery was noted as well. We will work to minimize complications.  Educational materials were available as well.  Questions were answered.  The patient expresses understanding & wishes to proceed with surgery.  -HTN controlled -h/o hemorrhagic stroke 2011 - no problems since with HTN control -VTE prophylaxis- SCDs, etc -mobilize as tolerated to help  recovery  Adin Hector, M.D., F.A.C.S. Gastrointestinal and Minimally Invasive Surgery [facility] [address], [place], [place] [address] [phone] Main / Paging   12/26/2014   Results:   Labs: Results for orders placed or performed during the hospital encounter of 12/25/14 (from the past 48 hour(s))  CBC with  Differential     Status: None   Collection Time: 12/25/14  5:20 PM  Result Value Ref Range   WBC 8.8 4.0 - 10.5 K/uL   RBC 4.91 4.22 - 5.81 MIL/uL   Hemoglobin 14.8 13.0 - 17.0 g/dL   HCT 43.9 39.0 - 52.0 %   MCV 89.4 78.0 - 100.0 fL   MCH 30.1 26.0 - 34.0 pg   MCHC 33.7 30.0 - 36.0 g/dL   RDW 12.9 11.5 - 15.5 %   Platelets 187 150 - 400 K/uL   Neutrophils Relative % 71 43 - 77 %   Neutro Abs 6.3 1.7 - 7.7 K/uL   Lymphocytes Relative 16 12 - 46 %   Lymphs Abs 1.4 0.7 - 4.0 K/uL   Monocytes Relative 10 3 - 12 %   Monocytes Absolute 0.8 0.1 - 1.0 K/uL   Eosinophils Relative 2 0 - 5 %   Eosinophils Absolute 0.2 0.0 - 0.7 K/uL   Basophils Relative 1 0 - 1 %   Basophils Absolute 0.0 0.0 - 0.1 K/uL  Basic metabolic panel     Status: Abnormal   Collection Time: 12/25/14  5:20 PM  Result Value Ref Range   Sodium 138 135 - 145 mmol/L    Comment: Please note change in reference range.   Potassium 3.6 3.5 - 5.1 mmol/L    Comment: Please note change in reference range.   Chloride 104 96 - 112 mEq/L   CO2 26 19 - 32 mmol/L   Glucose, Bld 90 70 - 99 mg/dL   BUN 18 6 - 23 mg/dL   Creatinine, Ser 0.90 0.50 - 1.35 mg/dL   Calcium 8.9 8.4 - 10.5 mg/dL   GFR calc non Af Amer 88 (L) >90 mL/min   GFR calc Af Amer >90 >90 mL/min    Comment: (NOTE) The eGFR has been calculated using the CKD EPI equation. This calculation has not been validated in all clinical situations. eGFR's persistently <90 mL/min signify possible Chronic Kidney Disease.    Anion gap 8 5 - 15  I-Stat CG4 Lactic Acid, ED     Status: None   Collection Time: 12/25/14  5:31 PM  Result Value Ref Range   Lactic Acid, Venous 0.76 0.5 - 2.2 mmol/L  Basic metabolic panel     Status: None   Collection Time: 12/26/14  5:50 AM  Result Value Ref Range   Sodium 141 135 - 145 mmol/L    Comment: Please note change in reference range.   Potassium 3.6 3.5 - 5.1 mmol/L    Comment: Please note change in reference range.   Chloride  109 96 - 112 mEq/L   CO2 25 19 - 32 mmol/L   Glucose, Bld 90 70 - 99 mg/dL   BUN 16 6 - 23 mg/dL   Creatinine, Ser 0.80 0.50 - 1.35 mg/dL   Calcium 8.6 8.4 - 10.5 mg/dL   GFR calc non Af Amer >90 >90 mL/min   GFR calc Af Amer >90 >90 mL/min    Comment: (NOTE) The eGFR has been calculated using the CKD EPI equation. This calculation has not been validated  in all clinical situations. eGFR's persistently <90 mL/min signify possible Chronic Kidney Disease.    Anion gap 7 5 - 15  CBC     Status: None   Collection Time: 12/26/14  5:50 AM  Result Value Ref Range   WBC 6.3 4.0 - 10.5 K/uL   RBC 4.61 4.22 - 5.81 MIL/uL   Hemoglobin 13.7 13.0 - 17.0 g/dL   HCT 41.6 39.0 - 52.0 %   MCV 90.2 78.0 - 100.0 fL   MCH 29.7 26.0 - 34.0 pg   MCHC 32.9 30.0 - 36.0 g/dL   RDW 13.1 11.5 - 15.5 %   Platelets 171 150 - 400 K/uL    Imaging / Studies: Ct Abdomen Pelvis W Contrast  12/25/2014   CLINICAL DATA:  Abdominal pain  EXAM: CT ABDOMEN AND PELVIS WITH CONTRAST  TECHNIQUE: Multidetector CT imaging of the abdomen and pelvis was performed using the standard protocol following bolus administration of intravenous contrast.  CONTRAST:  124m OMNIPAQUE IOHEXOL 300 MG/ML  SOLN  COMPARISON:  None.  FINDINGS: The lung bases are clear.  The liver demonstrates no focal abnormality. There is no intrahepatic or extrahepatic biliary ductal dilatation. The gallbladder is normal. The spleen demonstrates no focal abnormality. The kidneys, adrenal glands and pancreas are normal. The bladder is unremarkable.  There is gaseous distention of the sigmoid colon with a relative abrupt transition point involving the mid-distal sigmoid colon. There is a moderate amount of stool in the ascending colon. There is a small amount of fluid adjacent to the anti mesenteric aspect of the sigmoid colon which is located in the right side of the abdomen. There is a normal caliber appendix in the right lower quadrant without periappendiceal  inflammatory changes. There is no pneumoperitoneum, pneumatosis, or portal venous gas. There is no lymphadenopathy. There is a small amount of perihepatic and perisplenic fluid.  The abdominal aorta is normal in caliber with atherosclerosis.  There are no lytic or sclerotic osseous lesions. There is grade 1 anterolisthesis of L4 on L5. There is bilateral facet arthropathy at L4-5 and L5-S1.  IMPRESSION: 1. Sigmoid colon dilatation with a relative abrupt transition point involving the mid -distal sigmoid colon most concerning for a sigmoid volvulus. There is a small amount of abdominal free fluid.   Electronically Signed   By: HKathreen Devoid  On: 12/25/2014 18:48   Dg Abd Acute W/chest  12/25/2014   CLINICAL DATA:  Patient with abdominal bloating. History of prior bowel obstruction  EXAM: ACUTE ABDOMEN SERIES (ABDOMEN 2 VIEW & CHEST 1 VIEW)  COMPARISON:  06/22/2014  FINDINGS: Stable cardiac and mediastinal contours with mild tortuosity of the thoracic aorta. Minimal heterogeneous opacities bilateral lung bases, suggestive of atelectasis. No pleural effusion or pneumothorax.  There is suggestion of marked dilatation of the colon. There are scattered loops of prominent small bowel throughout the abdomen. Assessment for free air limited secondary to marked colonic distention. Regional skeleton is unremarkable.  IMPRESSION: There is marked distention of the colon which may be secondary to distal colonic obstruction. Colonic volvulus is not excluded.  Scattered prominent loops of small bowel.  These results will be called to the ordering clinician or representative by the Radiologist Assistant, and communication documented in the PACS or zVision Dashboard.   Electronically Signed   By: DLovey NewcomerM.D.   On: 12/25/2014 16:27    Medications / Allergies: per chart  Antibiotics: Anti-infectives    Start     Dose/Rate Route Frequency Ordered  Stop   12/27/14 0600  cefoTEtan (CEFOTAN) 2 g in dextrose 5 % 50 mL IVPB      2 g100 mL/hr over 30 Minutes Intravenous On call to O.R. 12/26/14 0741 12/28/14 0559   12/26/14 2300  neomycin (MYCIFRADIN) tablet 1,000 mg  Status:  Discontinued     1,000 mg Oral 3 times per day 12/26/14 0741 12/26/14 0806   12/26/14 2300  metroNIDAZOLE (FLAGYL) tablet 1,000 mg  Status:  Discontinued     1,000 mg Oral 3 times per day 12/26/14 0741 12/26/14 0806   12/26/14 1500  neomycin (MYCIFRADIN) tablet 1,000 mg  Status:  Discontinued     1,000 mg Oral 3 times per day 12/26/14 0741 12/26/14 0806   12/26/14 1500  metroNIDAZOLE (FLAGYL) tablet 1,000 mg  Status:  Discontinued     1,000 mg Oral 3 times per day 12/26/14 0741 12/26/14 0806   12/26/14 1300  neomycin (MYCIFRADIN) tablet 1,000 mg  Status:  Discontinued     1,000 mg Oral 3 times per day 12/26/14 0741 12/26/14 0806   12/26/14 1300  metroNIDAZOLE (FLAGYL) tablet 1,000 mg  Status:  Discontinued     1,000 mg Oral 3 times per day 12/26/14 0741 12/26/14 0806   12/26/14 0815  neomycin (MYCIFRADIN) tablet 1,000 mg    Comments:  by mouth at 2pm, 3pm, and 10pm the day before the colorectal operation   1,000 mg Oral 3 times per day 12/26/14 0806 12/26/14 1359   12/26/14 0815  metroNIDAZOLE (FLAGYL) tablet 1,000 mg    Comments:  by mouth at 2pm, 3pm, and 10pm the day before the colorectal operation, 1/11   1,000 mg Oral 3 times per day 12/26/14 0806 12/26/14 1359   12/26/14 0815  cefoTEtan (CEFOTAN) 2 g in dextrose 5 % 50 mL IVPB     2 g100 mL/hr over 30 Minutes Intravenous On call to O.R. 12/26/14 3403 12/27/14 0559       Note: Portions of this report may have been transcribed using voice recognition software. Every effort was made to ensure accuracy; however, inadvertent computerized transcription errors may be present.   Any transcriptional errors that result from this process are unintentional.

## 2014-12-26 NOTE — Progress Notes (Signed)
[facility] PROGRESS NOTE   Assessment/Plan: Sigmoid volvulus - s/p endocospy on 1.11.2016 with decompression of sigmoid volvulus. Appriciated Gi assistance. - add miralax. - surgery on board and rec sigmoid colectomy ion this admission ans this is a recurrent problem.  History of stroke - Hold antiplatelets therpay for procedure.  Essential Hypertension - stable, cont to hold ACE-I.  Hyperlipidemia - statins.    Code Status: full Family Communication: none  Disposition Plan: inpatient   Consultants:  Surgery  GI  Procedures:  CT abd and pelvis  Sigmoid colectomy with re-anastomosis  Antibiotics:  Cefoteta  HPI/Subjective: No complains  Objective: Filed Vitals:   12/26/14 0000 12/26/14 0045 12/26/14 0148 12/26/14 0630  BP: 150/75 111/72 127/78 133/89  Pulse: 72 71 76 78  Temp:   98.8 F (37.1 C) 98.6 F (37 C)  TempSrc:   Oral Oral  Resp:   18 18  Height:   5\' 8"  (1.727 m)   Weight:   86.682 kg (191 lb 1.6 oz)   SpO2: 94% 92% 97% 99%    Intake/Output Summary (Last 24 hours) at 12/26/14 1027 Last data filed at 12/26/14 0630  Gross per 24 hour  Intake    320 ml  Output    300 ml  Net     20 ml   Filed Weights   12/26/14 0148  Weight: 86.682 kg (191 lb 1.6 oz)    Exam:  General: Alert, awake, oriented x3, in no acute distress.  HEENT: No bruits, no goiter.    Data Reviewed: Basic Metabolic Panel:  Recent Labs Lab 12/25/14 1720 12/26/14 0550  NA 138 141  K 3.6 3.6  CL 104 109  CO2 26 25  GLUCOSE 90 90  BUN 18 16  CREATININE 0.90 0.80  CALCIUM 8.9 8.6   Liver Function Tests: No results for input(s): AST, ALT, ALKPHOS, BILITOT, PROT, ALBUMIN in the last 168 hours. No results for input(s): LIPASE, AMYLASE in the last 168 hours. No results for input(s): AMMONIA in the last 168 hours. CBC:  Recent Labs Lab 12/25/14 1528 12/25/14 1720 12/26/14 0550  WBC 8.3 8.8 6.3  NEUTROABS  --  6.3  --   HGB 15.1 14.8 13.7    HCT 45.7 43.9 41.6  MCV 89.6 89.4 90.2  PLT  --  187 171   Cardiac Enzymes: No results for input(s): CKTOTAL, CKMB, CKMBINDEX, TROPONINI in the last 168 hours. BNP (last 3 results) No results for input(s): PROBNP in the last 8760 hours. CBG: No results for input(s): GLUCAP in the last 168 hours.  No results found for this or any previous visit (from the past 240 hour(s)).   Studies: Ct Abdomen Pelvis W Contrast  12/25/2014   CLINICAL DATA:  Abdominal pain  EXAM: CT ABDOMEN AND PELVIS WITH CONTRAST  TECHNIQUE: Multidetector CT imaging of the abdomen and pelvis was performed using the standard protocol following bolus administration of intravenous contrast.  CONTRAST:  100mL OMNIPAQUE IOHEXOL 300 MG/ML  SOLN  COMPARISON:  None.  FINDINGS: The lung bases are clear.  The liver demonstrates no focal abnormality. There is no intrahepatic or extrahepatic biliary ductal dilatation. The gallbladder is normal. The spleen demonstrates no focal abnormality. The kidneys, adrenal glands and pancreas are normal. The bladder is unremarkable.  There is gaseous distention of the sigmoid colon with a relative abrupt transition point involving the mid-distal sigmoid colon. There is a moderate amount of stool in the ascending colon. There is a small amount of  fluid adjacent to the anti mesenteric aspect of the sigmoid colon which is located in the right side of the abdomen. There is a normal caliber appendix in the right lower quadrant without periappendiceal inflammatory changes. There is no pneumoperitoneum, pneumatosis, or portal venous gas. There is no lymphadenopathy. There is a small amount of perihepatic and perisplenic fluid.  The abdominal aorta is normal in caliber with atherosclerosis.  There are no lytic or sclerotic osseous lesions. There is grade 1 anterolisthesis of L4 on L5. There is bilateral facet arthropathy at L4-5 and L5-S1.  IMPRESSION: 1. Sigmoid colon dilatation with a relative abrupt transition  point involving the mid -distal sigmoid colon most concerning for a sigmoid volvulus. There is a small amount of abdominal free fluid.   Electronically Signed   By: Elige Ko   On: 12/25/2014 18:48   Dg Abd Acute W/chest  12/25/2014   CLINICAL DATA:  Patient with abdominal bloating. History of prior bowel obstruction  EXAM: ACUTE ABDOMEN SERIES (ABDOMEN 2 VIEW & CHEST 1 VIEW)  COMPARISON:  06/22/2014  FINDINGS: Stable cardiac and mediastinal contours with mild tortuosity of the thoracic aorta. Minimal heterogeneous opacities bilateral lung bases, suggestive of atelectasis. No pleural effusion or pneumothorax.  There is suggestion of marked dilatation of the colon. There are scattered loops of prominent small bowel throughout the abdomen. Assessment for free air limited secondary to marked colonic distention. Regional skeleton is unremarkable.  IMPRESSION: There is marked distention of the colon which may be secondary to distal colonic obstruction. Colonic volvulus is not excluded.  Scattered prominent loops of small bowel.  These results will be called to the ordering clinician or representative by the Radiologist Assistant, and communication documented in the PACS or zVision Dashboard.   Electronically Signed   By: Annia Belt M.D.   On: 12/25/2014 16:27    Scheduled Meds: . alvimopan  12 mg Oral Once  . [START ON 12/27/2014] cefoTEtan (CEFOTAN) IV  2 g Intravenous On Call to OR  . cefoTEtan (CEFOTAN) 2 GM IVPB  2 g Intravenous On Call to OR  . chlorhexidine  60 mL Topical Once   And  . [START ON 12/27/2014] chlorhexidine  60 mL Topical Once  . polyethylene glycol-electrolytes  2,880 mL Oral Once   Continuous Infusions: . sodium chloride 100 mL/hr at 12/26/14 1010     FELIZ Rosine Beat  [facility] Pager [phone]. If 8PM-8AM, please contact night-coverage at [email or web address], password [facility] 12/26/2014, 10:27 AM  LOS: 1 day

## 2014-12-27 ENCOUNTER — Encounter (HOSPITAL_COMMUNITY): Admission: EM | Disposition: A | Discharge status: Home / Self Care | Payer: Self-pay | Source: Home / Self Care

## 2014-12-27 SURGERY — COLECTOMY, SIGMOID, LAPAROSCOPIC
Anesthesia: General

## 2014-12-27 MED ORDER — BISACODYL 10 MG RE SUPP
10.0000 mg | Freq: Two times a day (BID) | RECTAL | Status: DC | PRN
Start: 2014-12-27 — End: 2014-12-31

## 2014-12-27 MED ORDER — LACTATED RINGERS IV BOLUS (SEPSIS): 1000.0000 mL | Freq: Three times a day (TID) | INTRAVENOUS | Status: AC | PRN

## 2014-12-27 MED ORDER — DIPHENHYDRAMINE HCL 50 MG/ML IJ SOLN
12.5000 mg | Freq: Four times a day (QID) | INTRAMUSCULAR | Status: DC | PRN
  Administered 2014-12-28: 25 mg via INTRAVENOUS
  Filled 2014-12-27: qty 1

## 2014-12-27 MED ORDER — DEXTROSE 5 % IV SOLN: 2.0000 g | INTRAVENOUS | Status: DC

## 2014-12-27 MED ORDER — ALUM & MAG HYDROXIDE-SIMETH 200-200-20 MG/5ML PO SUSP: 30.0000 mL | Freq: Four times a day (QID) | ORAL | Status: DC | PRN

## 2014-12-27 MED ORDER — METOPROLOL TARTRATE 12.5 MG HALF TABLET
12.5000 mg | ORAL_TABLET | Freq: Two times a day (BID) | ORAL | Status: DC | PRN
  Filled 2014-12-27: qty 1

## 2014-12-27 MED ORDER — MAGIC MOUTHWASH
15.0000 mL | Freq: Four times a day (QID) | ORAL | Status: DC | PRN
  Filled 2014-12-27: qty 15

## 2014-12-27 MED ORDER — PROMETHAZINE HCL 25 MG/ML IJ SOLN: 6.2500 mg | INTRAMUSCULAR | Status: DC | PRN

## 2014-12-27 MED ORDER — LIP MEDEX EX OINT
1.0000 "application " | TOPICAL_OINTMENT | Freq: Two times a day (BID) | CUTANEOUS | Status: DC
  Administered 2014-12-27 – 2014-12-31 (×8): 1 via TOPICAL
  Filled 2014-12-27: qty 7

## 2014-12-27 MED ORDER — MENTHOL 3 MG MT LOZG: 1.0000 | LOZENGE | OROMUCOSAL | Status: DC | PRN

## 2014-12-27 MED ORDER — MIDAZOLAM HCL 2 MG/2ML IJ SOLN
INTRAMUSCULAR | Status: AC
  Filled 2014-12-27: qty 2

## 2014-12-27 MED ORDER — PHENOL 1.4 % MT LIQD: 2.0000 | OROMUCOSAL | Status: DC | PRN

## 2014-12-27 MED ORDER — METOPROLOL TARTRATE 1 MG/ML IV SOLN
5.0000 mg | Freq: Four times a day (QID) | INTRAVENOUS | Status: DC | PRN
  Filled 2014-12-27: qty 5

## 2014-12-27 MED ORDER — PROPOFOL 10 MG/ML IV BOLUS
INTRAVENOUS | Status: AC
  Filled 2014-12-27: qty 20

## 2014-12-27 MED ORDER — ACETAMINOPHEN 325 MG PO TABS: 325.0000 mg | ORAL_TABLET | Freq: Four times a day (QID) | ORAL | Status: DC | PRN

## 2014-12-27 MED ORDER — ACETAMINOPHEN 650 MG RE SUPP: 650.0000 mg | Freq: Four times a day (QID) | RECTAL | Status: DC | PRN

## 2014-12-27 MED ORDER — HYDROMORPHONE HCL 1 MG/ML IJ SOLN
0.5000 mg | INTRAMUSCULAR | Status: DC | PRN
Start: 2014-12-27 — End: 2014-12-31
  Administered 2014-12-28 – 2014-12-31 (×7): 1 mg via INTRAVENOUS
  Filled 2014-12-27 (×8): qty 1

## 2014-12-27 MED ORDER — FENTANYL CITRATE 0.05 MG/ML IJ SOLN
INTRAMUSCULAR | Status: AC
  Filled 2014-12-27: qty 5

## 2014-12-27 MED ORDER — DEXTROSE 5 % IV SOLN
2.0000 g | INTRAVENOUS | Status: AC
  Administered 2014-12-28: 2 g via INTRAVENOUS
  Filled 2014-12-27: qty 2

## 2014-12-27 NOTE — Progress Notes (Signed)
2 Days Post-Op  Subjective: Doing well Ok after prep.  Planning on surgery today Objective: Vital signs in last 24 hours: Temp:  [98 F (36.7 C)-98.7 F (37.1 C)] 98.7 F (37.1 C) (01/12 0523) Pulse Rate:  [68-79] 74 (01/12 0523) Resp:  [18] 18 (01/12 0523) BP: (137-163)/(84-97) 142/85 mmHg (01/12 0523) SpO2:  [98 %-100 %] 98 % (01/12 0523) Last BM Date: 12/26/14 1680 PO yesterday NPO Afebrile, VSS No labs this AM No films Intake/Output from previous day: 01/11 0701 - 01/12 0700 In: 3934.2 [P.O.:1680; I.V.:2254.2] Out: 2250 [Urine:2250] Intake/Output this shift:    General appearance: alert, cooperative and no distress GI: soft, non-tender; bowel sounds normal; no masses,  no organomegaly  Lab Results:   Recent Labs  12/25/14 1720 12/26/14 0550  WBC 8.8 6.3  HGB 14.8 13.7  HCT 43.9 41.6  PLT 187 171    BMET  Recent Labs  12/25/14 1720 12/26/14 0550  NA 138 141  K 3.6 3.6  CL 104 109  CO2 26 25  GLUCOSE 90 90  BUN 18 16  CREATININE 0.90 0.80  CALCIUM 8.9 8.6   PT/INR No results for input(s): LABPROT, INR in the last 72 hours.  No results for input(s): AST, ALT, ALKPHOS, BILITOT, PROT, ALBUMIN in the last 168 hours.   Lipase  No results found for: LIPASE   Studies/Results: Ct Abdomen Pelvis W Contrast  12/25/2014   CLINICAL DATA:  Abdominal pain  EXAM: CT ABDOMEN AND PELVIS WITH CONTRAST  TECHNIQUE: Multidetector CT imaging of the abdomen and pelvis was performed using the standard protocol following bolus administration of intravenous contrast.  CONTRAST:  100mL OMNIPAQUE IOHEXOL 300 MG/ML  SOLN  COMPARISON:  None.  FINDINGS: The lung bases are clear.  The liver demonstrates no focal abnormality. There is no intrahepatic or extrahepatic biliary ductal dilatation. The gallbladder is normal. The spleen demonstrates no focal abnormality. The kidneys, adrenal glands and pancreas are normal. The bladder is unremarkable.  There is gaseous distention of the  sigmoid colon with a relative abrupt transition point involving the mid-distal sigmoid colon. There is a moderate amount of stool in the ascending colon. There is a small amount of fluid adjacent to the anti mesenteric aspect of the sigmoid colon which is located in the right side of the abdomen. There is a normal caliber appendix in the right lower quadrant without periappendiceal inflammatory changes. There is no pneumoperitoneum, pneumatosis, or portal venous gas. There is no lymphadenopathy. There is a small amount of perihepatic and perisplenic fluid.  The abdominal aorta is normal in caliber with atherosclerosis.  There are no lytic or sclerotic osseous lesions. There is grade 1 anterolisthesis of L4 on L5. There is bilateral facet arthropathy at L4-5 and L5-S1.  IMPRESSION: 1. Sigmoid colon dilatation with a relative abrupt transition point involving the mid -distal sigmoid colon most concerning for a sigmoid volvulus. There is a small amount of abdominal free fluid.   Electronically Signed   By: Elige KoHetal  Patel   On: 12/25/2014 18:48   Dg Abd Acute W/chest  12/25/2014   CLINICAL DATA:  Patient with abdominal bloating. History of prior bowel obstruction  EXAM: ACUTE ABDOMEN SERIES (ABDOMEN 2 VIEW & CHEST 1 VIEW)  COMPARISON:  06/22/2014  FINDINGS: Stable cardiac and mediastinal contours with mild tortuosity of the thoracic aorta. Minimal heterogeneous opacities bilateral lung bases, suggestive of atelectasis. No pleural effusion or pneumothorax.  There is suggestion of marked dilatation of the colon. There are scattered loops  of prominent small bowel throughout the abdomen. Assessment for free air limited secondary to marked colonic distention. Regional skeleton is unremarkable.  IMPRESSION: There is marked distention of the colon which may be secondary to distal colonic obstruction. Colonic volvulus is not excluded.  Scattered prominent loops of small bowel.  These results will be called to the ordering  clinician or representative by the Radiologist Assistant, and communication documented in the PACS or zVision Dashboard.   Electronically Signed   By: Annia Belt M.D.   On: 12/25/2014 16:27    Medications: . cefoTEtan (CEFOTAN) IV  2 g Intravenous On Call to OR   . sodium chloride 0.9 % 1,000 mL with potassium chloride 20 mEq infusion 100 mL/hr at 12/27/14 0402   Prior to Admission medications   Medication Sig Start Date End Date Taking? Authorizing Provider  clobetasol cream (TEMOVATE) 0.05 % Apply 1 application topically 2 (two) times daily. 11/07/14  Yes Gwenlyn Found Copland, MD  colchicine 0.6 MG tablet Take 0.6-1.2 mg by mouth daily as needed (gout attack). Take 2 tablets at onset of attack, then take 1 additional tablet an hour later   Yes Historical Provider, MD  lisinopril (PRINIVIL,ZESTRIL) 20 MG tablet Take 2 tablets (40 mg total) by mouth daily. 03/25/14  Yes Gwenlyn Found Copland, MD  pravastatin (PRAVACHOL) 40 MG tablet TAKE 1 TABLET BY MOUTH AT BEDTIME 12/05/14  Yes Jessica C Copland, MD  sildenafil (VIAGRA) 100 MG tablet Take 0.5-1 tablets (50-100 mg total) by mouth daily as needed for erectile dysfunction. 10/19/14   Pearline Cables, MD     Assessment/Plan Recurrent sigmoid volvulus Hypertension History of CVA Gout Arthritis Dyslipidemia  ED   Plan:  For surgery today.    LOS: 2 days    Judythe Postema 12/27/2014

## 2014-12-27 NOTE — Progress Notes (Signed)
[facility] PROGRESS NOTE   Assessment/Plan: Sigmoid volvulus - s/p endocospy on 1.11.2016 with decompression of sigmoid volvulus. Appriciated Gi assistance. - added miralax. - surgery on board and rec sigmoid colectomy on 1.12.2015.  History of stroke - Hold antiplatelets therpay for procedure.  Essential Hypertension - stable, cont to hold ACE-I.  Hyperlipidemia - statins.    Code Status: full Family Communication: none  Disposition Plan: inpatient   Consultants:  Surgery  GI  Procedures:  CT abd and pelvis  Sigmoid colectomy with re-anastomosis  Antibiotics:  Cefoteta  HPI/Subjective: No complains  Objective: Filed Vitals:   12/26/14 0630 12/26/14 1400 12/26/14 2230 12/27/14 0523  BP: 133/89 137/84 163/97 142/85  Pulse: 78 68 79 74  Temp: 98.6 F (37 C) 98 F (36.7 C) 98.2 F (36.8 C) 98.7 F (37.1 C)  TempSrc: Oral Oral Oral Oral  Resp: Height:      Weight:      SpO2: 99% 98% 100% 98%    Intake/Output Summary (Last 24 hours) at 12/27/14 1119 Last data filed at 12/27/14 1000  Gross per 24 hour  Intake 3034.16 ml  Output   2750 ml  Net 284.16 ml   Filed Weights   12/26/14 0148  Weight: 86.682 kg (191 lb 1.6 oz)    Exam:  General: Alert, awake, oriented x3, in no acute distress.  HEENT: No bruits, no goiter.    Data Reviewed: Basic Metabolic Panel:  Recent Labs Lab 12/25/14 1720 12/26/14 0550  NA 138 141  K 3.6 3.6  CL 104 109  CO2 26 25  GLUCOSE 90 90  BUN 18 16  CREATININE 0.90 0.80  CALCIUM 8.9 8.6   Liver Function Tests: No results for input(s): AST, ALT, ALKPHOS, BILITOT, PROT, ALBUMIN in the last 168 hours. No results for input(s): LIPASE, AMYLASE in the last 168 hours. No results for input(s): AMMONIA in the last 168 hours. CBC:  Recent Labs Lab 12/25/14 1528 12/25/14 1720 12/26/14 0550  WBC 8.3 8.8 6.3  NEUTROABS  --  6.3  --   HGB 15.1 14.8 13.7  HCT 45.7 43.9 41.6  MCV 89.6  89.4 90.2  PLT  --  187 171   Cardiac Enzymes: No results for input(s): CKTOTAL, CKMB, CKMBINDEX, TROPONINI in the last 168 hours. BNP (last 3 results) No results for input(s): PROBNP in the last 8760 hours. CBG: No results for input(s): GLUCAP in the last 168 hours.  Recent Results (from the past 240 hour(s))  Surgical pcr screen     Status: None   Collection Time: 12/26/14  7:49 PM  Result Value Ref Range Status   MRSA, PCR NEGATIVE NEGATIVE Final   Staphylococcus aureus NEGATIVE NEGATIVE Final    Comment:        The Xpert SA Assay (FDA approved for NASAL specimens in patients over 41 years of age), is one component of a comprehensive surveillance program.  Test performance has been validated by [employer] for patients greater than or equal to 44 year old. It is not intended to diagnose infection nor to guide or monitor treatment.      Studies: Ct Abdomen Pelvis W Contrast  12/25/2014   CLINICAL DATA:  Abdominal pain  EXAM: CT ABDOMEN AND PELVIS WITH CONTRAST  TECHNIQUE: Multidetector CT imaging of the abdomen and pelvis was performed using the standard protocol following bolus administration of intravenous contrast.  CONTRAST:  OMNIPAQUE IOHEXOL 300 MG/ML  SOLN  COMPARISON:  None.  FINDINGS: The lung bases are clear.  The liver demonstrates no focal abnormality. There is no intrahepatic or extrahepatic biliary ductal dilatation. The gallbladder is normal. The spleen demonstrates no focal abnormality. The kidneys, adrenal glands and pancreas are normal. The bladder is unremarkable.  There is gaseous distention of the sigmoid colon with a relative abrupt transition point involving the mid-distal sigmoid colon. There is a moderate amount of stool in the ascending colon. There is a small amount of fluid adjacent to the anti mesenteric aspect of the sigmoid colon which is located in the right side of the abdomen. There is a normal caliber appendix in the right lower quadrant  without periappendiceal inflammatory changes. There is no pneumoperitoneum, pneumatosis, or portal venous gas. There is no lymphadenopathy. There is a small amount of perihepatic and perisplenic fluid.  The abdominal aorta is normal in caliber with atherosclerosis.  There are no lytic or sclerotic osseous lesions. There is grade 1 anterolisthesis of L4 on L5. There is bilateral facet arthropathy at L4-5 and L5-S1.  IMPRESSION: 1. Sigmoid colon dilatation with a relative abrupt transition point involving the mid -distal sigmoid colon most concerning for a sigmoid volvulus. There is a small amount of abdominal free fluid.   Electronically Signed   By: Elige KoHetal  Patel   On: 12/25/2014 18:48   Dg Abd Acute W/chest  12/25/2014   CLINICAL DATA:  Patient with abdominal bloating. History of prior bowel obstruction  EXAM: ACUTE ABDOMEN SERIES (ABDOMEN 2 VIEW & CHEST 1 VIEW)  COMPARISON:  06/22/2014  FINDINGS: Stable cardiac and mediastinal contours with mild tortuosity of the thoracic aorta. Minimal heterogeneous opacities bilateral lung bases, suggestive of atelectasis. No pleural effusion or pneumothorax.  There is suggestion of marked dilatation of the colon. There are scattered loops of prominent small bowel throughout the abdomen. Assessment for free air limited secondary to marked colonic distention. Regional skeleton is unremarkable.  IMPRESSION: There is marked distention of the colon which may be secondary to distal colonic obstruction. Colonic volvulus is not excluded.  Scattered prominent loops of small bowel.  These results will be called to the ordering clinician or representative by the Radiologist Assistant, and communication documented in the PACS or zVision Dashboard.   Electronically Signed   By: Annia Beltrew  Davis M.D.   On: 12/25/2014 16:27    Scheduled Meds: . cefoTEtan (CEFOTAN) IV  2 g Intravenous On Call to OR   Continuous Infusions: . sodium chloride 0.9 % 1,000 mL with potassium chloride 20 mEq  infusion 100 mL/hr at 12/27/14 0402     Joseph [name], Joseph [name]  [facility] Pager [phone]. If 8PM-8AM, please contact night-coverage at [email or web address], password [email or web address] 12/27/2014, 11:19 AM  LOS: 2 days

## 2014-12-27 NOTE — Progress Notes (Signed)
Pt completed one 1,01900mL tap water enema. Result was light green with minimal sediment.   Sherron MondayGood, Anett Ranker L

## 2014-12-28 ENCOUNTER — Inpatient Hospital Stay (HOSPITAL_COMMUNITY): Payer: BLUE CROSS/BLUE SHIELD | Admitting: Anesthesiology

## 2014-12-28 ENCOUNTER — Encounter: Payer: Self-pay | Admitting: Specialist

## 2014-12-28 ENCOUNTER — Encounter (HOSPITAL_COMMUNITY): Admission: EM | Disposition: A | Discharge status: Home / Self Care | Payer: Self-pay | Source: Home / Self Care

## 2014-12-28 ENCOUNTER — Encounter (HOSPITAL_COMMUNITY): Payer: Self-pay

## 2014-12-28 DIAGNOSIS — K562 Volvulus: Secondary | ICD-10-CM | POA: Diagnosis present

## 2014-12-28 HISTORY — PX: LAPAROSCOPIC SIGMOID COLECTOMY: SHX5928

## 2014-12-28 HISTORY — PX: PROCTOSCOPY: SHX2266

## 2014-12-28 LAB — BASIC METABOLIC PANEL
ANION GAP: 6 (ref 5–15)
BUN: 5 mg/dL — ABNORMAL LOW (ref 6–23)
CO2: 24 mmol/L (ref 19–32)
CREATININE: 0.8 mg/dL (ref 0.50–1.35)
Calcium: 8.7 mg/dL (ref 8.4–10.5)
Chloride: 109 mEq/L (ref 96–112)
GFR calc non Af Amer: >90 mL/min (ref >90)
Glucose, Bld: 103 mg/dL — ABNORMAL HIGH (ref 70–99)
POTASSIUM: 3.4 mmol/L — AB (ref 3.5–5.1)
SODIUM: 139 mmol/L (ref 135–145)

## 2014-12-28 LAB — CBC
HCT: 40.6 % (ref 39.0–52.0)
Hemoglobin: 13.7 g/dL (ref 13.0–17.0)
MCH: 29.8 pg (ref 26.0–34.0)
MCHC: 33.7 g/dL (ref 30.0–36.0)
MCV: 88.3 fL (ref 78.0–100.0)
PLATELETS: 170 10*3/uL (ref 150–400)
RBC: 4.6 MIL/uL (ref 4.22–5.81)
RDW: 12.8 % (ref 11.5–15.5)
WBC: 4.6 10*3/uL (ref 4.0–10.5)

## 2014-12-28 SURGERY — COLECTOMY, SIGMOID, LAPAROSCOPIC
Anesthesia: General | Site: Abdomen

## 2014-12-28 MED ORDER — PROPOFOL 10 MG/ML IV BOLUS
INTRAVENOUS | Status: DC | PRN
  Administered 2014-12-28: 150 mg via INTRAVENOUS
  Administered 2014-12-28: 10 mg via INTRAVENOUS

## 2014-12-28 MED ORDER — ALVIMOPAN 12 MG PO CAPS
12.0000 mg | ORAL_CAPSULE | Freq: Two times a day (BID) | ORAL | Status: DC
  Administered 2014-12-29 (×2): 12 mg via ORAL
  Filled 2014-12-28 (×4): qty 1

## 2014-12-28 MED ORDER — HYDROMORPHONE HCL 1 MG/ML IJ SOLN
INTRAMUSCULAR | Status: AC
  Filled 2014-12-28: qty 1

## 2014-12-28 MED ORDER — DEXTROSE 5 % IV SOLN
2.0000 g | Freq: Two times a day (BID) | INTRAVENOUS | Status: AC
  Administered 2014-12-28: 2 g via INTRAVENOUS
  Filled 2014-12-28: qty 2

## 2014-12-28 MED ORDER — GLYCOPYRROLATE 0.2 MG/ML IJ SOLN
INTRAMUSCULAR | Status: DC | PRN
  Administered 2014-12-28: .6 mg via INTRAVENOUS

## 2014-12-28 MED ORDER — GLYCOPYRROLATE 0.2 MG/ML IJ SOLN
INTRAMUSCULAR | Status: AC
  Filled 2014-12-28: qty 3

## 2014-12-28 MED ORDER — CEFOTETAN DISODIUM-DEXTROSE 2-2.08 GM-% IV SOLR
INTRAVENOUS | Status: AC
  Filled 2014-12-28: qty 50

## 2014-12-28 MED ORDER — SODIUM CHLORIDE 0.9 % IV SOLN
INTRAVENOUS | Status: AC
  Administered 2014-12-28: 12:00:00 via INTRAPERITONEAL
  Filled 2014-12-28: qty 6

## 2014-12-28 MED ORDER — HYDROMORPHONE HCL 1 MG/ML IJ SOLN
INTRAMUSCULAR | Status: DC | PRN
  Administered 2014-12-28: 1 mg via INTRAVENOUS
  Administered 2014-12-28 (×2): 0.5 mg via INTRAVENOUS

## 2014-12-28 MED ORDER — BUPIVACAINE-EPINEPHRINE (PF) 0.25% -1:200000 IJ SOLN
INTRAMUSCULAR | Status: AC
  Filled 2014-12-28: qty 30

## 2014-12-28 MED ORDER — OXYCODONE HCL 5 MG PO TABS: 5.0000 mg | ORAL_TABLET | ORAL | Status: DC | PRN

## 2014-12-28 MED ORDER — HYDROMORPHONE HCL 1 MG/ML IJ SOLN
0.2500 mg | INTRAMUSCULAR | Status: DC | PRN
  Administered 2014-12-28 (×2): 0.5 mg via INTRAVENOUS

## 2014-12-28 MED ORDER — FENTANYL CITRATE 0.05 MG/ML IJ SOLN
INTRAMUSCULAR | Status: AC
  Filled 2014-12-28: qty 2

## 2014-12-28 MED ORDER — FENTANYL CITRATE 0.05 MG/ML IJ SOLN
25.0000 ug | INTRAMUSCULAR | Status: DC | PRN
  Administered 2014-12-28 (×3): 50 ug via INTRAVENOUS

## 2014-12-28 MED ORDER — LACTATED RINGERS IV BOLUS (SEPSIS)
1000.0000 mL | Freq: Three times a day (TID) | INTRAVENOUS | Status: AC | PRN
Start: 2014-12-28 — End: 2014-12-30

## 2014-12-28 MED ORDER — ROCURONIUM BROMIDE 100 MG/10ML IV SOLN
INTRAVENOUS | Status: AC
  Filled 2014-12-28: qty 1

## 2014-12-28 MED ORDER — PROMETHAZINE HCL 25 MG/ML IJ SOLN: 6.2500 mg | INTRAMUSCULAR | Status: DC | PRN

## 2014-12-28 MED ORDER — ONDANSETRON HCL 4 MG/2ML IJ SOLN
INTRAMUSCULAR | Status: AC
  Filled 2014-12-28: qty 2

## 2014-12-28 MED ORDER — FENTANYL CITRATE 0.05 MG/ML IJ SOLN
INTRAMUSCULAR | Status: AC
  Filled 2014-12-28: qty 5

## 2014-12-28 MED ORDER — FENTANYL CITRATE 0.05 MG/ML IJ SOLN
INTRAMUSCULAR | Status: DC | PRN
  Administered 2014-12-28: 50 ug via INTRAVENOUS
  Administered 2014-12-28 (×2): 100 ug via INTRAVENOUS

## 2014-12-28 MED ORDER — LIDOCAINE HCL (CARDIAC) 20 MG/ML IV SOLN
INTRAVENOUS | Status: AC
  Filled 2014-12-28: qty 5

## 2014-12-28 MED ORDER — ACETAMINOPHEN 500 MG PO TABS
1000.0000 mg | ORAL_TABLET | Freq: Three times a day (TID) | ORAL | Status: DC
  Administered 2014-12-28 – 2014-12-31 (×9): 1000 mg via ORAL
  Filled 2014-12-28 (×10): qty 2

## 2014-12-28 MED ORDER — HEPARIN SODIUM (PORCINE) 5000 UNIT/ML IJ SOLN
5000.0000 [IU] | Freq: Three times a day (TID) | INTRAMUSCULAR | Status: DC
  Administered 2014-12-28 – 2014-12-31 (×9): 5000 [IU] via SUBCUTANEOUS
  Filled 2014-12-28 (×12): qty 1

## 2014-12-28 MED ORDER — HEPARIN SODIUM (PORCINE) 5000 UNIT/ML IJ SOLN
INTRAMUSCULAR | Status: AC
  Filled 2014-12-28: qty 1

## 2014-12-28 MED ORDER — HEPARIN SODIUM (PORCINE) 5000 UNIT/ML IJ SOLN: INTRAMUSCULAR | Status: DC | PRN

## 2014-12-28 MED ORDER — LACTATED RINGERS IV SOLN
INTRAVENOUS | Status: DC
  Administered 2014-12-28: 12:00:00 via INTRAVENOUS
  Administered 2014-12-28: 1000 mL via INTRAVENOUS
  Administered 2014-12-28: 10:00:00 via INTRAVENOUS

## 2014-12-28 MED ORDER — BUPIVACAINE-EPINEPHRINE 0.25% -1:200000 IJ SOLN
INTRAMUSCULAR | Status: AC
  Filled 2014-12-28: qty 1

## 2014-12-28 MED ORDER — ROCURONIUM BROMIDE 100 MG/10ML IV SOLN
INTRAVENOUS | Status: DC | PRN
Start: 2014-12-28 — End: 2014-12-28
  Administered 2014-12-28: 20 mg via INTRAVENOUS
  Administered 2014-12-28: 10 mg via INTRAVENOUS
  Administered 2014-12-28: 40 mg via INTRAVENOUS

## 2014-12-28 MED ORDER — MIDAZOLAM HCL 2 MG/2ML IJ SOLN
INTRAMUSCULAR | Status: AC
  Filled 2014-12-28: qty 2

## 2014-12-28 MED ORDER — MIDAZOLAM HCL 5 MG/5ML IJ SOLN
INTRAMUSCULAR | Status: DC | PRN
  Administered 2014-12-28: 2 mg via INTRAVENOUS

## 2014-12-28 MED ORDER — HYDROMORPHONE HCL 2 MG/ML IJ SOLN
INTRAMUSCULAR | Status: AC
  Filled 2014-12-28: qty 1

## 2014-12-28 MED ORDER — ALVIMOPAN 12 MG PO CAPS
12.0000 mg | ORAL_CAPSULE | Freq: Once | ORAL | Status: AC
  Administered 2014-12-28: 12 mg via ORAL
  Filled 2014-12-28: qty 1

## 2014-12-28 MED ORDER — SACCHAROMYCES BOULARDII 250 MG PO CAPS
250.0000 mg | ORAL_CAPSULE | Freq: Two times a day (BID) | ORAL | Status: DC
  Administered 2014-12-28 – 2014-12-31 (×6): 250 mg via ORAL
  Filled 2014-12-28 (×7): qty 1

## 2014-12-28 MED ORDER — BUPIVACAINE-EPINEPHRINE 0.25% -1:200000 IJ SOLN
INTRAMUSCULAR | Status: DC | PRN
  Administered 2014-12-28: 80 mL

## 2014-12-28 MED ORDER — LACTATED RINGERS IR SOLN
Status: DC | PRN
  Administered 2014-12-28: 1000 mL

## 2014-12-28 MED ORDER — HYDRALAZINE HCL 20 MG/ML IJ SOLN
5.0000 mg | Freq: Four times a day (QID) | INTRAMUSCULAR | Status: DC | PRN
  Administered 2014-12-28 – 2014-12-31 (×5): 5 mg via INTRAVENOUS
  Filled 2014-12-28 (×5): qty 1

## 2014-12-28 MED ORDER — PROPOFOL 10 MG/ML IV BOLUS
INTRAVENOUS | Status: AC
  Filled 2014-12-28: qty 20

## 2014-12-28 MED ORDER — 0.9 % SODIUM CHLORIDE (POUR BTL) OPTIME
TOPICAL | Status: DC | PRN
  Administered 2014-12-28: 2000 mL

## 2014-12-28 MED ORDER — NEOSTIGMINE METHYLSULFATE 10 MG/10ML IV SOLN
INTRAVENOUS | Status: DC | PRN
  Administered 2014-12-28: 3.5 mg via INTRAVENOUS

## 2014-12-28 SURGICAL SUPPLY — 72 items
APPLIER CLIP 5 13 M/L LIGAMAX5 (MISCELLANEOUS)
APPLIER CLIP ROT 10 11.4 M/L (STAPLE)
BLADE EXTENDED COATED 6.5IN (ELECTRODE) IMPLANT
CABLE HIGH FREQUENCY MONO STRZ (ELECTRODE) ×4 IMPLANT
CATH KIT ON-Q SILVERSOAK 7.5IN (CATHETERS) IMPLANT
CELLS DAT CNTRL 66122 CELL SVR (MISCELLANEOUS) IMPLANT
CHLORAPREP W/TINT 26ML (MISCELLANEOUS) ×4 IMPLANT
CLIP APPLIE 5 13 M/L LIGAMAX5 (MISCELLANEOUS) IMPLANT
CLIP APPLIE ROT 10 11.4 M/L (STAPLE) IMPLANT
COUNTER NEEDLE 20 DBL MAG RED (NEEDLE) ×4 IMPLANT
DECANTER SPIKE VIAL GLASS SM (MISCELLANEOUS) ×4 IMPLANT
DRAIN CHANNEL 19F RND (DRAIN) IMPLANT
DRAPE LAPAROSCOPIC ABDOMINAL (DRAPES) ×4 IMPLANT
DRAPE UTILITY XL STRL (DRAPES) ×4 IMPLANT
DRSG OPSITE POSTOP 4X10 (GAUZE/BANDAGES/DRESSINGS) IMPLANT
DRSG OPSITE POSTOP 4X6 (GAUZE/BANDAGES/DRESSINGS) ×4 IMPLANT
DRSG OPSITE POSTOP 4X8 (GAUZE/BANDAGES/DRESSINGS) IMPLANT
DRSG TEGADERM 2-3/8X2-3/4 SM (GAUZE/BANDAGES/DRESSINGS) IMPLANT
DRSG TEGADERM 4X4.75 (GAUZE/BANDAGES/DRESSINGS) IMPLANT
ELECT PENCIL ROCKER SW 15FT (MISCELLANEOUS) ×8 IMPLANT
ELECT REM PT RETURN 9FT ADLT (ELECTROSURGICAL) ×4
ELECTRODE REM PT RTRN 9FT ADLT (ELECTROSURGICAL) ×2 IMPLANT
EVACUATOR SILICONE 100CC (DRAIN) IMPLANT
GAUZE SPONGE 2X2 8PLY STRL LF (GAUZE/BANDAGES/DRESSINGS) IMPLANT
GAUZE SPONGE 4X4 12PLY STRL (GAUZE/BANDAGES/DRESSINGS) IMPLANT
GLOVE ECLIPSE 8.0 STRL XLNG CF (GLOVE) ×8 IMPLANT
GLOVE INDICATOR 8.0 STRL GRN (GLOVE) ×8 IMPLANT
GOWN STRL REUS W/TWL XL LVL3 (GOWN DISPOSABLE) ×24 IMPLANT
LEGGING LITHOTOMY PAIR STRL (DRAPES) ×4 IMPLANT
LUBRICANT JELLY K Y 4OZ (MISCELLANEOUS) IMPLANT
PACK COLON (CUSTOM PROCEDURE TRAY) ×4 IMPLANT
PACK GENERAL/GYN (CUSTOM PROCEDURE TRAY) ×4 IMPLANT
PORT LAP GEL ALEXIS MED 5-9CM (MISCELLANEOUS) ×4 IMPLANT
PUMP PAIN ON-Q (MISCELLANEOUS) IMPLANT
RTRCTR WOUND ALEXIS 18CM MED (MISCELLANEOUS)
SCISSORS LAP 5X35 DISP (ENDOMECHANICALS) ×4 IMPLANT
SEALER TISSUE G2 STRG ARTC 35C (ENDOMECHANICALS) ×4 IMPLANT
SET IRRIG TUBING LAPAROSCOPIC (IRRIGATION / IRRIGATOR) ×4 IMPLANT
SLEEVE XCEL OPT CAN 5 100 (ENDOMECHANICALS) ×8 IMPLANT
SPONGE GAUZE 2X2 STER 10/PKG (GAUZE/BANDAGES/DRESSINGS)
SPONGE LAP 18X18 X RAY DECT (DISPOSABLE) ×4 IMPLANT
STAPLER CIRC ILS CVD 33MM 37CM (STAPLE) ×4 IMPLANT
STAPLER CUT CVD 40MM BLUE (STAPLE) IMPLANT
STAPLER VISISTAT 35W (STAPLE) ×4 IMPLANT
SUCTION POOLE TIP (SUCTIONS) ×4 IMPLANT
SUT MNCRL AB 4-0 PS2 18 (SUTURE) ×4 IMPLANT
SUT PDS AB 1 CTX 36 (SUTURE) IMPLANT
SUT PDS AB 1 TP1 96 (SUTURE) IMPLANT
SUT PROLENE 0 CT 2 (SUTURE) IMPLANT
SUT SILK 2 0 (SUTURE) ×2
SUT SILK 2 0 SH CR/8 (SUTURE) ×4 IMPLANT
SUT SILK 2-0 18XBRD TIE 12 (SUTURE) ×2 IMPLANT
SUT SILK 3 0 (SUTURE) ×2
SUT SILK 3 0 SH CR/8 (SUTURE) ×4 IMPLANT
SUT SILK 3-0 18XBRD TIE 12 (SUTURE) ×2 IMPLANT
SUT V-LOC BARB 180 2/0GR6 GS22 (SUTURE)
SUT VICRYL 0 UR6 27IN ABS (SUTURE) ×4 IMPLANT
SUT VLOC 180 2-0 9IN GS21 (SUTURE) IMPLANT
SUTURE V-LC BRB 180 2/0GR6GS22 (SUTURE) IMPLANT
SYS LAPSCP GELPORT 120MM (MISCELLANEOUS)
SYSTEM LAPSCP GELPORT 120MM (MISCELLANEOUS) IMPLANT
TAPE UMBILICAL COTTON 1/8X30 (MISCELLANEOUS) IMPLANT
TOWEL OR 17X26 10 PK STRL BLUE (TOWEL DISPOSABLE) ×8 IMPLANT
TOWEL OR NON WOVEN STRL DISP B (DISPOSABLE) ×8 IMPLANT
TRAY FOLEY CATH 14FRSI W/METER (CATHETERS) ×4 IMPLANT
TROCAR BLADELESS OPT 5 100 (ENDOMECHANICALS) ×4 IMPLANT
TROCAR XCEL 12X100 BLDLESS (ENDOMECHANICALS) IMPLANT
TROCAR XCEL NON-BLD 11X100MML (ENDOMECHANICALS) IMPLANT
TUBING CONNECTING 10 (TUBING) IMPLANT
TUBING CONNECTING 10' (TUBING)
TUBING FILTER THERMOFLATOR (ELECTROSURGICAL) ×4 IMPLANT
TUNNELER SHEATH ON-Q 16GX12 DP (PAIN MANAGEMENT) IMPLANT

## 2014-12-28 NOTE — Progress Notes (Signed)
Lengthy visit yesterday as result of page that patient wanted to see a chaplain before surgery. Patient was processing his life narrative and finding meaning and purpose in parts of it. He grew [employer] but is now attending a [employer] [place]. He is a man of faith, but has also struggled with the complexities of life and choices. I mostly listened and asked open-ended questions to help him clarify the issues that he seemed to be working through. He seemed to gain some insight by the end of our visit; yet, he had still not found the answers he thinks he needs. I will follow up after surgery. Listening; facilitated his his quest for meaning and purpose. Prayer.  Donnelly StagerAlexis Reiana Poteet, PhD, [facility] Chaplain

## 2014-12-28 NOTE — Op Note (Signed)
12/25/2014 - 12/28/2014  1:18 PM  PATIENT:  Joseph [name]  64 y.o. male  Patient Care Team: No Pcp Per Patient as PCP - General (General Practice)  PRE-OPERATIVE DIAGNOSIS:  Recurrent sigmoid volvulus  POST-OPERATIVE DIAGNOSIS:  Recurrent sigmoid volvulus  PROCEDURE:  Procedure(s): LAPAROSCOPIC SIGMOID COLECTOMY PROCTOSCOPY  SURGEON:  Surgeon(s): Karie SodaSteven Amanie Mcculley, MD  ASSISTANT: RN   ANESTHESIA:   local and general  EBL:  Total I/O In: 1000 [I.V.:1000] Out: 600 [Urine:550; Blood:50]  Delay start of Pharmacological VTE agent (>24hrs) due to surgical blood loss or risk of bleeding:  no  DRAINS: none   SPECIMEN:  Source of Specimen:  SIGMOID COLON (OPEN END PROXIMAL).  2.  ANASTOMOTIC RING - DISTAL "FINAL" MARGIN  DISPOSITION OF SPECIMEN:  PATHOLOGY  COUNTS:  YES  PLAN OF CARE: Admit to inpatient   PATIENT DISPOSITION:  PACU - hemodynamically stable.  INDICATION:    Pleasant male with sigmoid volvulus status post endoscopic decompression.  Recommend consider surgical follow-up.  Lost to follow-up.  Had another episode of volvulus.  Admitted.  Was able to be endoscopically untwisted partially.  I recommended segmental resection:  The anatomy & physiology of the digestive tract was discussed.  The pathophysiology was discussed.  Natural history risks without surgery was discussed.   I worked to give an overview of the disease and the frequent need to have multispecialty involvement.  I feel the risks of no intervention will lead to serious problems that outweigh the operative risks; therefore, I recommended a partial colectomy to remove the pathology.  Laparoscopic & open techniques were discussed.   Risks such as bleeding, infection, abscess, leak, reoperation, possible ostomy, hernia, heart attack, death, and other risks were discussed.  I noted a good likelihood this will help address the problem.   Goals of post-operative recovery were discussed as well.  We will work to  minimize complications.  Educational materials on the pathology had been given in the office.  Questions were answered.    The patient expressed understanding & wished to proceed with surgery.  OR FINDINGS:   Patient had dilated partially volvulized sigmoid colon.    No obvious metastatic disease on visceral parietal peritoneum or liver.  The anastomosis rests 15 cm from the anal verge by rigid proctoscopy.  It is a 33 EEA stapled anastomosis.  DESCRIPTION:   Informed consent was confirmed.  The patient underwent general anaesthesia without difficulty.  The patient was positioned appropriately.  VTE prevention in place.  The patient's abdomen was clipped, prepped, & draped in a sterile fashion.  Surgical timeout confirmed our plan.  The patient was positioned in reverse Trendelenburg.  Abdominal entry was gained using optical entry technique in the right right upper abdomen.  Entry was clean.  I induced carbon dioxide insufflation.  Camera inspection revealed no injury.  Extra ports were carefully placed under direct laparoscopic visualization.  I reflected the short greater omentum and the upper abdomen the small bowel in the upper abdomen.  Patient had a very dilated boggy sigmoid colon partially volvulized.  Was able to elevate it and fully untwisted.  However the patient had very dense adhesions of the jejunal mesentery to the descending colon and sigmoid colon.  This provided a central fibrotic focal point for which the colon wished to re-twist/revolvulize.  I scored the base of peritoneum of the right side of the mesentery of the left colon to the peritoneal reflection of the mid rectum.  I elevated the sigmoid mesentery and  entered into the retro-mesenteric plane. We were able to identify the right & left ureter and gonadal vessels. We kept those posterior within the retroperitoneum and elevated the left colon mesentery off that.  Carefully elevated the small bowel mesentery off the mid  descending and proximal sigmoid colon.  Very dense fibrotic adhesions.  I help separate the regions with sharp scissors.  Inspected and saw no injury to bowel or cecum or appendix.  With that I could better score the left colon mesentery and elevate the left colon anteriorly.  I did isolate IMA pedicle but did not ligate it yet.  I continued distally and got into the avascular plane posterior to the mesorectum. This allowed me to help mobilize the rectum as well by freeing the mesorectum off the sacrum.  I mobilized the peritoneal coverings towards the peritoneal reflection on both the right and left sides of the rectum.  I could see the right and left ureters and stayed away from them.  I kept the lateral vascular pedicles to the rectum intact.  I skeletonized the lymph nodes off the inferior mesenteric artery pedicle.  I transected the superior hemorrhoidal artery off the inferior mesenteric artery, making an effort to preserve the left colic artery.  We ensured hemostasis. I skeletonized the mesorectum at the junction at the proximal rectum using blunt dissection & bipolar EnSeal.  Because of the volvulus I left the proximal left colon retroperitoneal attachments alone.  This came partially up the descending colon until I found a region that would reach down from the mid descending colon down to the sacral promontory.  I skeletonized the mesocolon distal to the left colic artery and pedicle to preserve good blood supply.  I placed a wound protector through a Pfannenstiel incision in the suprapubic region, taking care to avoid bladder injury. I transected bowel at the proximal rectum using a contour stapler. I was able to eviscerate the rectosigmoid and descending colon out the wound.  I conform the skeletonized region at the mid-descending colon that was soft and easily reached down. I clamped the colon proximal to this area using a soft bowel clamp. I transected the descending colon with a scalpel. I got  healthy bleeding mucosa.  We sent the rectosigmoid colon specimen off to go to pathology.  We sized the colon orifice.  I chose a 33 EEA anvil stapler system. I placed the anvil to the open end of the descending colon and closed around it using a 0 Prolene pursestring.  We did copious irrigation with crystalloid solution.  Hemostasis was good.  The distal end of the colon at the handle easily reached down to the rectal stump, therefore, splenic flexure mobilization was not needed.      I scrubbed down and did gentle anal dilation and advanced the EEA stapler up the rectal stump. The spike was brought out at the provimal end of the rectal stump under direct visualization.  We attached the anvil of the proximal colon the spike of the stapler. Anvil was tightened down and held clamped for 60 seconds. The EEA stapler was fired and held clamped for 30 seconds. The stapler was released & removed. We noted 2 excellent anastomotic rings. Blue stitch is in the proximal ring.  I did rigid proctoscopy noted the anastomosis was at 15 cm from the anal verge consistent with the proximal rectum.  We did a final irrigation of antibiotic solution (900 mg clindamycin/240 mg gentamicin in a liter of crystalloid) & held that  for the pelvic air leak test .  The rectum was insufflated the rectum while clamping the colon proximal to that anastomosis.  There was a negative air leak test. There was no tension of mesentery or bowel at the anastomosis.   Tissues looked viable.    We changed gown and gloves.  The patient was re-draped.  I did abdominal exploration.  Small bowel and colon looked normal.  No evidence of mesenteric or bowel injury.  Hemostasis was good.   Ureters & bowel uninjured.  The anastomosis looked healthy.  Sterile unused instruments were used from this point out per colon SSI prevention protocol.   I closed the 5mm port sites using Monocryl stitch and sterile dressing.  I closed the Pfannenstiel wound using a 0  Vicryl vertical peritoneal closure and a #1 PDS transverse anterior rectal fascial closure. I closed the skin with some interrupted Monocryl stitches. I placed antibiotic-soaked wicks into the closure at the corners & centrally x4 between those areas. I placed a sterile dressing.    Patient is being extubated go to recovery room. I discussed postop care with the patient in detail the office & in the holding area. Instructions are written. I updated the status of the patient to the patient's family (mother & sisters).  I made recommendations.  I answered questions.  Understanding & appreciation was expressed.   Ardeth Sportsman, M.D., F.A.C.S. Gastrointestinal and Minimally Invasive Surgery [facility] [address] [place], [place] [address] [phone] Main / Paging

## 2014-12-28 NOTE — Anesthesia Postprocedure Evaluation (Signed)
  Anesthesia Post-op Note  Patient: Joseph [name]  Procedure(s) Performed: Procedure(s): LAPAROSCOPIC SIGMOID COLECTOMY (N/A) PROCTOSCOPY  Patient Location: PACU  Anesthesia Type:General  Level of Consciousness: awake and alert   Airway and Oxygen Therapy: Patient Spontanous Breathing  Post-op Pain: none  Post-op Assessment: Post-op Vital signs reviewed  Post-op Vital Signs: Reviewed  Last Vitals:  Filed Vitals:   12/28/14 1440  BP: 154/95  Pulse: 81  Temp: 36.8 C  Resp: 15    Complications: No apparent anesthesia complications

## 2014-12-28 NOTE — Anesthesia Procedure Notes (Signed)
Procedure Name: Intubation Date/Time: 12/28/2014 10:46 AM Performed by: Paris LoreBLANTON, Luigi Stuckey M Pre-anesthesia Checklist: Patient identified, Emergency Drugs available, Suction available, Patient being monitored and Timeout performed Patient Re-evaluated:Patient Re-evaluated prior to inductionOxygen Delivery Method: Circle system utilized Preoxygenation: Pre-oxygenation with 100% oxygen Intubation Type: IV induction Ventilation: Mask ventilation without difficulty Laryngoscope Size: Mac and 4 Grade View: Grade I Tube type: Oral Tube size: 7.5 mm Number of attempts: 1 Airway Equipment and Method: Stylet Placement Confirmation: ETT inserted through vocal cords under direct vision,  positive ETCO2,  CO2 detector and breath sounds checked- equal and bilateral Secured at: 22 cm Tube secured with: Tape Dental Injury: Teeth and Oropharynx as per pre-operative assessment

## 2014-12-28 NOTE — Transfer of Care (Signed)
Immediate Anesthesia Transfer of Care Note  Patient: Joseph [name]  Procedure(s) Performed: Procedure(s) (LRB): LAPAROSCOPIC SIGMOID COLECTOMY (N/A) PROCTOSCOPY  Patient Location: PACU  Anesthesia Type: General  Level of Consciousness: sedated, patient cooperative and responds to stimulation  Airway & Oxygen Therapy: Patient Spontanous Breathing and Patient connected to face mask oxgen  Post-op Assessment: Report given to PACU RN and Post -op Vital signs reviewed and stable  Post vital signs: Reviewed and stable  Complications: No apparent anesthesia complications

## 2014-12-28 NOTE — Anesthesia Preprocedure Evaluation (Addendum)
Anesthesia Evaluation  Patient identified by MRN, date of birth, ID band Patient awake    Reviewed: Allergy & Precautions, NPO status , Patient's Chart, lab work & pertinent test results  Airway Mallampati: II  TM Distance: >3 FB Neck ROM: Full    Dental  (+) Missing   Pulmonary neg pulmonary ROS,          Cardiovascular hypertension, Pt. on medications     Neuro/Psych CVA negative psych ROS   GI/Hepatic Neg liver ROS, Recurrent sigmoid volvulus   Endo/Other  negative endocrine ROS  Renal/GU negative Renal ROS     Musculoskeletal  (+) Arthritis -,   Abdominal   Peds  Hematology negative hematology ROS (+)   Anesthesia Other Findings   Reproductive/Obstetrics                           Anesthesia Physical Anesthesia Plan  ASA: III  Anesthesia Plan: General   Post-op Pain Management:    Induction: Intravenous  Airway Management Planned: Oral ETT  Additional Equipment:   Intra-op Plan:   Post-operative Plan: Extubation in OR  Informed Consent: I have reviewed the patients History and Physical, chart, labs and discussed the procedure including the risks, benefits and alternatives for the proposed anesthesia with the patient or authorized representative who has indicated his/her understanding and acceptance.   Dental advisory given  Plan Discussed with: CRNA  Anesthesia Plan Comments:         Anesthesia Quick Evaluation

## 2014-12-28 NOTE — Progress Notes (Addendum)
Patient ID: Joseph [name], male   DOB: 1951-01-06, 64 y.o.   MRN: [id number] [facility] PROGRESS NOTE  Joseph [name] NGE:[id number] DOB: 1951-01-06 DOA: 12/25/2014 PCP: No PCP Per Patient  Brief narrative:    64 yo male with past medical history of stroke, hypertension who [facility] with progressively worsening abdominal pain with bloating for the past 2 days prior to this admission. Patient reported no bowel movements in the last 2 days. No associated nausea, vomiting or fevers. On admission, patient was noted to have sigmoid volvulus. Patient underwent decompression of sigmoid volvulus by GI on 12/26/2014. Patient is scheduled for surgery today.   Assessment/Plan:    Sigmoid volvulus - s/p endocospy on 1.11.2016 with decompression of sigmoid volvulus.  - Appreciate GI assistance  - patient will require sigmoid colectomy, scheduled for today. Appreciate surgery following.  History of stroke - Hold antiplatelets therpay for procedure.  Essential Hypertension - hydralazine as needed for better blood pressure control  Hyperlipidemia - resume statins once able to take by mouth.    DVT Prophylaxis  - SCD's bilaterally   Code Status: Full.  Family Communication:  plan of care discussed with the patient Disposition Plan: Home when stable.   IV access:  Peripheral IV  Procedures and diagnostic studies:    Ct Abdomen Pelvis W Contrast 12/25/2014   1. Sigmoid colon dilatation with a relative abrupt transition point involving the mid -distal sigmoid colon most concerning for a sigmoid volvulus. There is a small amount of abdominal free fluid.   Electronically Signed   By: Elige KoHetal  Patel   On: 12/25/2014 18:48   Dg Abd Acute W/chest 12/25/2014   There is marked distention of the colon which may be secondary to distal colonic obstruction. Colonic volvulus is not excluded.  Scattered prominent loops of small bowel.     Medical Consultants:  Surgery   Gastroenterology  Other Consultants:  None   IAnti-Infectives:   Cefotetan 12/26/2014 -->  Flagyl 12/26/2014 -->    Manson PasseyEVINE, Christia Domke, MD  [facility] Pager [phone]  If 7PM-7AM, please contact night-coverage [email or web address] Password [email or web address] 12/28/2014, 11:39 AM   LOS: 3 days    HPI/Subjective: No acute overnight events.  Objective: Filed Vitals:   12/27/14 1610 12/27/14 2202 12/28/14 0635 12/28/14 0835  BP: 154/90 153/95 155/95 166/91  Pulse: 84 76 68 83  Temp:  98.4 F (36.9 C) 98.1 F (36.7 C) 97.6 F (36.4 C)  TempSrc:  Oral Oral Oral  Resp: 20 18 18 18   Height:      Weight:      SpO2: 98% 98% 99% 99%    Intake/Output Summary (Last 24 hours) at 12/28/14 1139 Last data filed at 12/28/14 1115  Gross per 24 hour  Intake    800 ml  Output   2650 ml  Net  -1850 ml    Exam:   General:  Pt is alert, follows commands appropriately, not in acute distress  Cardiovascular: Regular rate and rhythm, S1/S2 (+)  Respiratory: no wheezing, no crackles, no rhonchi  Abdomen: Soft, non tender, non distended, bowel sounds present  Extremities: No edema, pulses DP and PT palpable bilaterally  Neuro: Grossly nonfocal  Data Reviewed: Basic Metabolic Panel:  Recent Labs Lab 12/25/14 1720 12/26/14 0550 12/28/14 0511  NA 138 141 139  K 3.6 3.6 3.4*  CL 104 109 109  CO2 26 25 24   GLUCOSE 90 90 103*  BUN 18 16 5*  CREATININE 0.90 0.80 0.80  CALCIUM 8.9 8.6 8.7   Liver Function Tests: No results for input(s): AST, ALT, ALKPHOS, BILITOT, PROT, ALBUMIN in the last 168 hours. No results for input(s): LIPASE, AMYLASE in the last 168 hours. No results for input(s): AMMONIA in the last 168 hours. CBC:  Recent Labs Lab 12/25/14 1528 12/25/14 1720 12/26/14 0550 12/28/14 0511  WBC 8.3 8.8 6.3 4.6  NEUTROABS  --  6.3  --   --   HGB 15.1 14.8 13.7 13.7  HCT 45.7 43.9 41.6 40.6  MCV 89.6 89.4 90.2 88.3  PLT  --  187 171 170   Cardiac Enzymes: No results for  input(s): CKTOTAL, CKMB, CKMBINDEX, TROPONINI in the last 168 hours. BNP: Invalid input(s): POCBNP CBG: No results for input(s): GLUCAP in the last 168 hours.  Recent Results (from the past 240 hour(s))  Surgical pcr screen     Status: None   Collection Time: 12/26/14  7:49 PM  Result Value Ref Range Status   MRSA, PCR NEGATIVE NEGATIVE Final   Staphylococcus aureus NEGATIVE NEGATIVE Final     Scheduled Meds: . alvimopan  12 mg Oral Once  . clindamycin / gentamicin INTRAPERITONEAL Lavage irrigation   Intraperitoneal To OR  . heparin subcutaneous  5,000 Units Subcutaneous 3 times per day  . [MAR Hold] lip balm  1 application Topical BID   Continuous Infusions: . lactated ringers 1,000 mL (12/28/14 0918)  . sodium chloride 0.9 % 1,000 mL with potassium chloride 20 mEq infusion 100 mL/hr at 12/28/14 0030

## 2014-12-28 NOTE — Progress Notes (Signed)
[facility]  [address] [place], [place] [address] Phone: [phone] FAX: [place] [phone] May 21, 1951  CARE TEAM:  PCP: No PCP Per Patient  Outpatient Care Team: Patient Care Team: No Pcp Per Patient as PCP - General (General Practice)  Inpatient Treatment Team: Treatment Team: Attending Provider: Robbie Lis, MD; Consulting Physician: Nolon Nations, MD; Rounding Team: Ian Bushman, MD; Technician: Leda Quail, NT; Respiratory Therapist: Gonzella Lex, RRT; Consulting Physician: Cleotis Nipper, MD; Registered Nurse: July Charlynn Grimes, RN; Registered Nurse: Eston Mould, RN   Subjective:  No events Denies abdominal pain Tol prep  Objective:  Vital signs:  Filed Vitals:   12/27/14 1610 12/27/14 2202 12/28/14 0635 12/28/14 0835  BP: 154/90 153/95 155/95 166/91  Pulse: 84 76 68 83  Temp:  98.4 F (36.9 C) 98.1 F (36.7 C) 97.6 F (36.4 C)  TempSrc:  Oral Oral Oral  Resp: '20 18 18 18  ' Height:      Weight:      SpO2: 98% 98% 99% 99%    Last BM Date: 12/27/14  Intake/Output   Yesterday:  01/12 0701 - 01/13 0700 In: 1206.7 [I.V.:1206.7] Out: 2600 [Urine:2600] This shift:     Bowel function:  Flatus: y  BM: n  Drain: n/a  Physical Exam:  General: Pt awake/alert/oriented x4 in no acute distress Eyes: PERRL, normal EOM.  Sclera clear.  No icterus Neuro: CN II-XII intact w/o focal sensory/motor deficits. Lymph: No head/neck/groin lymphadenopathy Psych:  No delerium/psychosis/paranoia HENT: Normocephalic, Mucus membranes moist.  No thrush Neck: Supple, No tracheal deviation Chest: No chest wall pain w good excursion CV:  Pulses intact.  Regular rhythm MS: Normal AROM mjr joints.  No obvious deformity Abdomen: Soft.  Nondistended.  Nontender.  No evidence of peritonitis.  No incarcerated hernias. Ext:  SCDs BLE.  No mjr edema.  No cyanosis Skin: No  petechiae / purpura   Problem List:   Principal Problem:   Sigmoid volvulus Active Problems:   Hypertension   Hyperlipidemia   History of stroke   Assessment  Joseph [name]  64 y.o. male  Day of Surgery  Procedure(s): COLONOSCOPY  Recurrent sigmoid volvulus  Plan:  Sigmoid colectomy today  Plan MIS approach - lap.  Should be able to avoid temp colostomy since now decompressed.   D/w patient:  The anatomy & physiology of the digestive tract was discussed.  The pathophysiology of the colon was discussed.  Natural history risks without surgery was discussed.   I feel the risks of no intervention will lead to serious problems that outweigh the operative risks; therefore, I recommended a partial colectomy to remove the pathology.  Minimally invasive (Robotic/Laparoscopic) & open techniques were discussed.   Risks such as bleeding, infection, abscess, leak, reoperation, possible ostomy, hernia, heart attack, stroke, death, and other risks were discussed.  I noted a good likelihood this will help address the problem.   Goals of post-operative recovery were discussed as well.   Need for adequate nutrition, daily bowel regimen and healthy physical activity, to optimize recovery was noted as well. We will work to minimize complications.  Educational materials were available as well.  Questions were answered.  The patient expresses understanding & wishes to proceed with surgery.  -HTN controlled -h/o hemorrhagic stroke 2011 - no problems since with HTN control -VTE prophylaxis- SCDs, etc -mobilize as tolerated to help recovery  Adin Hector, M.D., F.A.C.S. Gastrointestinal and  Minimally Invasive Surgery [facility] [address], [place], [place] [address] [phone] Main / Paging   12/28/2014   Results:   Labs: Results for orders placed or performed during the hospital encounter of 12/25/14 (from the past 48 hour(s))  Surgical pcr screen      Status: None   Collection Time: 12/26/14  7:49 PM  Result Value Ref Range   MRSA, PCR NEGATIVE NEGATIVE   Staphylococcus aureus NEGATIVE NEGATIVE    Comment:        The Xpert SA Assay (FDA approved for NASAL specimens in patients over 83 years of age), is one component of a comprehensive surveillance program.  Test performance has been validated by [employer] for patients greater than or equal to 40 year old. It is not intended to diagnose infection nor to guide or monitor treatment.   CBC     Status: None   Collection Time: 12/28/14  5:11 AM  Result Value Ref Range   WBC 4.6 4.0 - 10.5 K/uL   RBC 4.60 4.22 - 5.81 MIL/uL   Hemoglobin 13.7 13.0 - 17.0 g/dL   HCT 40.6 39.0 - 52.0 %   MCV 88.3 78.0 - 100.0 fL   MCH 29.8 26.0 - 34.0 pg   MCHC 33.7 30.0 - 36.0 g/dL   RDW 12.8 11.5 - 15.5 %   Platelets 170 150 - 400 K/uL  Basic metabolic panel     Status: Abnormal   Collection Time: 12/28/14  5:11 AM  Result Value Ref Range   Sodium 139 135 - 145 mmol/L    Comment: Please note change in reference range.   Potassium 3.4 (L) 3.5 - 5.1 mmol/L    Comment: Please note change in reference range.   Chloride 109 96 - 112 mEq/L   CO2 24 19 - 32 mmol/L   Glucose, Bld 103 (H) 70 - 99 mg/dL   BUN 5 (L) 6 - 23 mg/dL    Comment: RESULT REPEATED AND VERIFIED DELTA CHECK NOTED    Creatinine, Ser 0.80 0.50 - 1.35 mg/dL   Calcium 8.7 8.4 - 10.5 mg/dL   GFR calc non Af Amer >90 >90 mL/min   GFR calc Af Amer >90 >90 mL/min    Comment: (NOTE) The eGFR has been calculated using the CKD EPI equation. This calculation has not been validated in all clinical situations. eGFR's persistently <90 mL/min signify possible Chronic Kidney Disease.    Anion gap 6 5 - 15    Imaging / Studies: No results found.  Medications / Allergies: per chart  Antibiotics: Anti-infectives    Start     Dose/Rate Route Frequency Ordered Stop   12/28/14 1030  clindamycin (CLEOCIN) 900 mg, gentamicin  (GARAMYCIN) 240 mg in sodium chloride 0.9 % 1,000 mL for intraperitoneal lavage      Intraperitoneal To Surgery 12/28/14 1019 12/29/14 1030   12/28/14 0600  cefoTEtan (CEFOTAN) 2 g in dextrose 5 % 50 mL IVPB  Status:  Discontinued    Comments:  Pharmacy may adjust dose strength for optimal dosing.   Send with patient on call to the OR.  Anesthesia to complete antibiotic administration [facility]68mn [facility]   2 g100 mL/hr over 30 Minutes Intravenous On call to O.R. 12/27/14 1442 12/27/14 1443   12/28/14 0600  cefoTEtan (CEFOTAN) 2 g in dextrose 5 % 50 mL IVPB     2 g100 mL/hr over 30 Minutes Intravenous On call to  O.R. 12/27/14 1443 12/29/14 0559   12/27/14 0600  cefoTEtan (CEFOTAN) 2 g in dextrose 5 % 50 mL IVPB  Status:  Discontinued     2 g100 mL/hr over 30 Minutes Intravenous On call to O.R. 12/26/14 0741 12/27/14 1443   12/26/14 2300  neomycin (MYCIFRADIN) tablet 1,000 mg  Status:  Discontinued     1,000 mg Oral 3 times per day 12/26/14 0741 12/26/14 0806   12/26/14 2300  metroNIDAZOLE (FLAGYL) tablet 1,000 mg  Status:  Discontinued     1,000 mg Oral 3 times per day 12/26/14 0741 12/26/14 0806   12/26/14 1500  neomycin (MYCIFRADIN) tablet 1,000 mg  Status:  Discontinued     1,000 mg Oral 3 times per day 12/26/14 0741 12/26/14 0806   12/26/14 1500  metroNIDAZOLE (FLAGYL) tablet 1,000 mg  Status:  Discontinued     1,000 mg Oral 3 times per day 12/26/14 0741 12/26/14 0806   12/26/14 1300  neomycin (MYCIFRADIN) tablet 1,000 mg  Status:  Discontinued     1,000 mg Oral 3 times per day 12/26/14 0741 12/26/14 0806   12/26/14 1300  metroNIDAZOLE (FLAGYL) tablet 1,000 mg  Status:  Discontinued     1,000 mg Oral 3 times per day 12/26/14 0741 12/26/14 0806   12/26/14 0900  neomycin (MYCIFRADIN) tablet 1,000 mg    Comments:  by mouth at 2pm, 3pm, and 10pm the day before the colorectal operation   1,000 mg Oral 3 times per day 12/26/14 0806 12/26/14 1018   12/26/14 0900   metroNIDAZOLE (FLAGYL) tablet 1,000 mg    Comments:  by mouth at 2pm, 3pm, and 10pm the day before the colorectal operation, 1/11   1,000 mg Oral 3 times per day 12/26/14 0806 12/26/14 1018   12/26/14 0815  cefoTEtan (CEFOTAN) 2 g in dextrose 5 % 50 mL IVPB     2 g100 mL/hr over 30 Minutes Intravenous On call to O.R. 12/26/14 4210 12/27/14 0559       Note: Portions of this report may have been transcribed using voice recognition software. Every effort was made to ensure accuracy; however, inadvertent computerized transcription errors may be present.   Any transcriptional errors that result from this process are unintentional.

## 2014-12-28 NOTE — Discharge Instructions (Signed)
ABDOMINAL SURGERY: POST OP INSTRUCTIONS  1. DIET: Follow a light bland diet the first 24 hours after arrival home, such as soup, liquids, crackers, etc.  Be sure to include lots of fluids daily.  Avoid fast food or heavy meals as your are more likely to get nauseated.  Eat a low fat the next few days after surgery.   2. Take your usually prescribed home medications unless otherwise directed. 3. PAIN CONTROL: a. Pain is best controlled by a usual combination of three different methods TOGETHER: i. Ice/Heat ii. Over the counter pain medication iii. Prescription pain medication b. Most patients will experience some swelling and bruising around the incisions.  Ice packs or heating pads (30-60 minutes up to 6 times a day) will help. Use ice for the first few days to help decrease swelling and bruising, then switch to heat to help relax tight/sore spots and speed recovery.  Some people prefer to use ice alone, heat alone, alternating between ice & heat.  Experiment to what works for you.  Swelling and bruising can take several weeks to resolve.   c. It is helpful to take an over-the-counter pain medication regularly for the first few weeks.  Choose one of the following that works best for you: i. Naproxen (Aleve, etc)  Two 228m tabs twice a day ii. Ibuprofen (Advil, etc) Three 2071mtabs four times a day (every meal & bedtime) iii. Acetaminophen (Tylenol, etc) 500-65061mour times a day (every meal & bedtime) d. A  prescription for pain medication (such as oxycodone, hydrocodone, etc) should be given to you upon discharge.  Take your pain medication as prescribed.  i. If you are having problems/concerns with the prescription medicine (does not control pain, nausea, vomiting, rash, itching, etc), please call us [phone] see if we need to switch you to a different pain medicine that will work better for you and/or control your side effect better. ii. If you need a refill on your pain medication,  please contact your pharmacy.  They will contact our office to request authorization. Prescriptions will not be filled after 5 pm or on week-ends. 4. Avoid getting constipated.  Between the surgery and the pain medications, it is common to experience some constipation.  Increasing fluid intake and taking a fiber supplement (such as Metamucil, Citrucel, FiberCon, MiraLax, etc) 1-2 times a day regularly will usually help prevent this problem from occurring.  A mild laxative (prune juice, Milk of Magnesia, MiraLax, etc) should be taken according to package directions if there are no bowel movements after 48 hours.   5. Watch out for diarrhea.  If you have many loose bowel movements, simplify your diet to bland foods & liquids for a few days.  Stop any stool softeners and decrease your fiber supplement.  Switching to mild anti-diarrheal medications (Kayopectate, Pepto Bismol) can help.  If this worsens or does not improve, please call us.[place]. Wash / shower every day.  You may shower over the incision / wound.  Avoid baths until the skin is fully healed.  Continue to shower over incision(s) after the dressing is off. 7. Remove your waterproof bandages 5 days after surgery.  You may leave the incision open to air.  Remove any wicks or ribbons in your wound.  If you have an open wound, please see wound care instructions. You may replace a dressing/Band-Aid to cover the incision for comfort if you wish. 8. ACTIVITIES as tolerated:   a. You may resume regular (light)  daily activities beginning the next day--such as daily self-care, walking, climbing stairs--gradually increasing activities as tolerated.  If you can walk 30 minutes without difficulty, it is safe to try more intense activity such as jogging, treadmill, bicycling, low-impact aerobics, swimming, etc. b. Save the most intensive and strenuous activity for last such as sit-ups, heavy lifting, contact sports, etc  Refrain from any heavy lifting or straining  until you are off narcotics for pain control.   c. DO NOT PUSH THROUGH PAIN.  Let pain be your guide: If it hurts to do something, don't do it.  Pain is your body warning you to avoid that activity for another week until the pain goes down. d. You may drive when you are no longer taking prescription pain medication, you can comfortably wear a seatbelt, and you can safely maneuver your car and apply brakes. e. Dennis Bast may have sexual intercourse when it is comfortable.  9. FOLLOW UP in our office a. Please call [facility] at [phone] to set up an appointment to see your surgeon in the office for a follow-up appointment approximately 1-2 weeks after your surgery. b. Make sure that you call for this appointment the day you arrive home to insure a convenient appointment time. 10. IF YOU HAVE DISABILITY OR FAMILY LEAVE FORMS, BRING THEM TO THE OFFICE FOR PROCESSING.  DO NOT GIVE THEM TO YOUR DOCTOR.   WHEN TO CALL us [phone]: 1. Poor pain control 2. Reactions / problems with new medications (rash/itching, nausea, etc)  3. Fever over 101.5 F (38.5 C) 4. Inability to urinate 5. Nausea and/or vomiting 6. Worsening swelling or bruising 7. Continued bleeding from incision. 8. Increased pain, redness, or drainage from the incision  The clinic staff is available to answer your questions during regular business hours (8:30am-5pm).  Please dont hesitate to call and ask to speak to one of our nurses for clinical concerns.   A surgeon from [facility] is always on call at the hospitals   If you have a medical emergency, go to the nearest emergency room or call 911.    [facility], [address], [place], [place], [place]  [address] ? MAIN: [phone] ? TOLL FREE: [phone] ? FAX [phone] [email or web address]  GETTING TO GOOD BOWEL HEALTH. Irregular bowel habits such as constipation and diarrhea can lead to many problems over time.   Having one soft bowel movement a day is the most important way to prevent further problems.  The anorectal canal is designed to handle stretching and feces to safely manage our ability to get rid of solid waste (feces, poop, stool) out of our body.  BUT, hard constipated stools can act like ripping concrete bricks and diarrhea can be a burning fire to this very sensitive area of our body, causing inflamed hemorrhoids, anal fissures, increasing risk is perirectal abscesses, abdominal pain/bloating, an making irritable bowel worse.     The goal: ONE SOFT BOWEL MOVEMENT A DAY!  To have soft, regular bowel movements:   Drink at least 8 tall glasses of water a day.    Take plenty of fiber.  Fiber is the undigested part of plant food that passes into the colon, acting s natures broom to encourage bowel motility and movement.  Fiber can absorb and hold large amounts of water. This results in a larger, bulkier stool, which is soft and easier to pass. Work gradually over several weeks up to 6 servings a day of fiber (  25g a day even more if needed) in the form of: o Vegetables -- Root (potatoes, carrots, turnips), leafy green (lettuce, salad greens, celery, spinach), or cooked high residue (cabbage, broccoli, etc) o Fruit -- Fresh (unpeeled skin & pulp), Dried (prunes, apricots, cherries, etc ),  or stewed ( applesauce)  o Whole grain breads, pasta, etc (whole wheat)  o Bran cereals   Bulking Agents -- This type of water-retaining fiber generally is easily obtained each day by one of the following:  o Psyllium bran -- The psyllium plant is remarkable because its ground seeds can retain so much water. This product is available as Metamucil, Konsyl, Effersyllium, Per Diem Fiber, or the less expensive generic preparation in drug and health food stores. Although labeled a laxative, it really is not a laxative.  o Methylcellulose -- This is another fiber derived from wood which also retains water. It is available as  Citrucel. o Polyethylene Glycol - and artificial fiber commonly called Miralax or Glycolax.  It is helpful for people with gassy or bloated feelings with regular fiber o Flax Seed - a less gassy fiber than psyllium  No reading or other relaxing activity while on the toilet. If bowel movements take longer than 5 minutes, you are too constipated  AVOID CONSTIPATION.  High fiber and water intake usually takes care of this.  Sometimes a laxative is needed to stimulate more frequent bowel movements, but   Laxatives are not a good long-term solution as it can wear the colon out. o Osmotics (Milk of Magnesia, Fleets phosphosoda, Magnesium citrate, MiraLax, GoLytely) are safer than  o Stimulants (Senokot, Castor Oil, Dulcolax, Ex Lax)    o Do not take laxatives for more than 7days in a row.   IF SEVERELY CONSTIPATED, try a Bowel Retraining Program: o Do not use laxatives.  o Eat a diet high in roughage, such as bran cereals and leafy vegetables.  o Drink six (6) ounces of prune or apricot juice each morning.  o Eat two (2) large servings of stewed fruit each day.  o Take one (1) heaping tablespoon of a psyllium-based bulking agent twice a day. Use sugar-free sweetener when possible to avoid excessive calories.  o Eat a normal breakfast.  o Set aside 15 minutes after breakfast to sit on the toilet, but do not strain to have a bowel movement.  o If you do not have a bowel movement by the third day, use an enema and repeat the above steps.   Controlling diarrhea o Switch to liquids and simpler foods for a few days to avoid stressing your intestines further. o Avoid dairy products (especially milk & ice cream) for a short time.  The intestines often can lose the ability to digest lactose when stressed. o Avoid foods that cause gassiness or bloating.  Typical foods include beans and other legumes, cabbage, broccoli, and dairy foods.  Every person has some sensitivity to other foods, so listen to our  body and avoid those foods that trigger problems for you. o Adding fiber (Citrucel, Metamucil, psyllium, Miralax) gradually can help thicken stools by absorbing excess fluid and retrain the intestines to act more normally.  Slowly increase the dose over a few weeks.  Too much fiber too soon can backfire and cause cramping & bloating. o Probiotics (such as active yogurt, Align, etc) may help repopulate the intestines and colon with normal bacteria and calm down a sensitive digestive tract.  Most studies show it to be of mild help,  though, and such products can be costly. o Medicines: - Bismuth subsalicylate (ex. Kayopectate, Pepto Bismol) every 30 minutes for up to 6 doses can help control diarrhea.  Avoid if pregnant. - Loperamide (Immodium) can slow down diarrhea.  Start with two tablets (4mg  total) first and then try one tablet every 6 hours.  Avoid if you are having fevers or severe pain.  If you are not better or start feeling worse, stop all medicines and call your doctor for advice o Call your doctor if you are getting worse or not better.  Sometimes further testing (cultures, endoscopy, X-ray studies, bloodwork, etc) may be needed to help diagnose and treat the cause of the diarrhea.  Managing Pain  Pain after surgery or related to activity is often due to strain/injury to muscle, tendon, nerves and/or incisions.  This pain is usually short-term and will improve in a few months.   Many people find it helpful to do the following things TOGETHER to help speed the process of healing and to get back to regular activity more quickly:  1. Avoid heavy physical activity a.  no lifting greater than 20 pounds b. Do not push through the pain.  Listen to your body and avoid positions and maneuvers than reproduce the pain c. Walking is okay as tolerated, but go slowly and stop when getting sore.  d. Remember: If it hurts to do it, then dont do it! 2. Take Anti-inflammatory medication  a. Take with  food/snack around the clock for 1-2 weeks i. This helps the muscle and nerve tissues become less irritable and calm down faster b. Choose ONE of the following over-the-counter medications: i. Naproxen 220mg  tabs (ex. Aleve) 1-2 pills twice a day  ii. Ibuprofen 200mg  tabs (ex. Advil, Motrin) 3-4 pills with every meal and just before bedtime iii. Acetaminophen 500mg  tabs (Tylenol) 1-2 pills with every meal and just before bedtime 3. Use a Heating pad or Ice/Cold Pack a. 4-6 times a day b. May use warm bath/hottub  or showers 4. Try Gentle Massage and/or Stretching  a. at the area of pain many times a day b. stop if you feel pain - do not overdo it  Try these steps together to help you body heal faster and avoid making things get worse.  Doing just one of these things may not be enough.    If you are not getting better after two weeks or are noticing you are getting worse, contact our office for further advice; we may need to re-evaluate you & see what other things we can do to help.  Laparoscopic Colectomy Laparoscopic colectomy is surgery to remove part or all of the large intestine (colon). This procedure is used to treat several conditions, including:  Inflammation and infection of the colon (diverticulitis).  Tumors or masses in the colon.  Inflammatory bowel disease, such as Crohn disease or ulcerative colitis. Colectomy is an option when symptoms cannot be controlled with medicines.  Bleeding from the colon that cannot be controlled by another method.  Blockage or obstruction of the colon. [facility] KNOW ABOUT:  Any allergies you have.  All medicines you are taking, including vitamins, herbs, eye drops, creams, and over-the-counter medicines.  Previous problems you or members of your family have had with the use of anesthetics.  Any blood disorders you have.  Previous surgeries you have had.  Medical conditions you have. RISKS AND  COMPLICATIONS Generally, this is a safe procedure. However, as with any  procedure, complications can occur. Possible complications include:  Infection.  Bleeding.  Damage to other organs.  Leaking from where the colon was sewn together.  Future blockage of the small intestines from scar tissue. Another surgery may be needed to repair this. In some cases, complications such as damage to other organs or excessive bleeding may require the surgeon to convert from a laparoscopic procedure to an open procedure. This involves making a larger incision in the abdomen to perform the procedure. BEFORE THE PROCEDURE  Ask your health care provider about changing or stopping any regular medicines.  You may be prescribed an oral bowel prep. This involves drinking a large amount of medicated liquid, starting the day before your surgery. The liquid will cause you to have multiple loose stools until your stool is almost clear or light green. This cleans out your colon in preparation for the surgery.  Do not eat or drink anything else once you have started the bowel prep, unless your health care provider tells you it is safe to do so.  You may also be given antibiotic pills to clean out your colon of bacteria. Be sure to follow the directions carefully and take the medicine at the correct time. PROCEDURE   Small monitors will be put on your body. They are used to check your heart, blood pressure, and oxygen level.  An IV access tube will be put into one of your veins. Medicine will be able to flow directly into your body through this IV tube.  You might be given a medicine to help you relax (sedative).  You will be given a medicine to make you sleep through the procedure (general anesthetic). A breathing tube may be placed into your lungs during the procedure.  A thin, flexible tube (catheter) will be placed into your bladder to collect urine.  A tube may be put in through your nose. It is called a  nasogastric tube. It is used to remove stomach fluids after surgery until the intestines start working again.  Your abdomen will be filled with air so that it expands. This gives the surgeon more room to operate and makes your organs easier to see.  Several small cuts (incisions) are made in your abdomen.  A thin, lighted tube with a tiny camera on the end (laparoscope) is put through one of the small incisions. The camera on the laparoscope sends a picture to a TV screen in the operating room. This gives the surgeon a good view inside your abdomen.  Hollow tubes are put through the other small incisions in your abdomen. The tools needed for the procedure are put through these tubes.  Clamps or staples are put on both ends of the diseased part of the colon.  The part of the intestine between the clamps or staples is removed.  If possible, the ends of the healthy colon that remain will be stitched or stapled together to allow your body to expel waste (stool).  Sometimes, the remaining colon cannot be stitched back together. If this is the case, a colostomy is needed. For a colostomy:  An opening (stoma) to the outside of your body is made through the abdomen.  The end of the colon is brought to the opening. It is stitched to the skin.  A bag is attached to the opening. Stool will drain into this bag. The bag is removable.  The colostomy can be temporary or permanent.  The incisions from the colectomy are closed with stitches or  staples. AFTER THE PROCEDURE  You will be monitored closely in a recovery area until you are stable and doing well. You will then be moved to a regular hospital room.  You will need to receive fluids through an IV tube until your bowel function has returned. This may take 1-3 days. Once your bowels are working again, you will be started on clear liquids and then advanced to solid food as tolerated.  You will be given pain medicines to control your  pain. Document Released: 02/22/2003 Document Revised: 09/22/2013 Document Reviewed: 07/14/2013 [facility] Patient Information 2015 [place], [place]. This information is not intended to replace advice given to you by your health care provider. Make sure you discuss any questions you have with your health care provider.

## 2014-12-29 ENCOUNTER — Encounter (HOSPITAL_COMMUNITY): Payer: Self-pay | Admitting: Surgery

## 2014-12-29 LAB — CBC
HCT: 40.4 % (ref 39.0–52.0)
Hemoglobin: 13.7 g/dL (ref 13.0–17.0)
MCH: 30 pg (ref 26.0–34.0)
MCHC: 33.9 g/dL (ref 30.0–36.0)
MCV: 88.4 fL (ref 78.0–100.0)
PLATELETS: 173 10*3/uL (ref 150–400)
RBC: 4.57 MIL/uL (ref 4.22–5.81)
RDW: 13 % (ref 11.5–15.5)
WBC: 10 10*3/uL (ref 4.0–10.5)

## 2014-12-29 LAB — BASIC METABOLIC PANEL
ANION GAP: 9 (ref 5–15)
BUN: 8 mg/dL (ref 6–23)
CHLORIDE: 106 meq/L (ref 96–112)
CO2: 25 mmol/L (ref 19–32)
Calcium: 8.8 mg/dL (ref 8.4–10.5)
Creatinine, Ser: 0.93 mg/dL (ref 0.50–1.35)
GFR calc non Af Amer: 87 mL/min — ABNORMAL LOW (ref >90)
GLUCOSE: 105 mg/dL — AB (ref 70–99)
Potassium: 3.9 mmol/L (ref 3.5–5.1)
Sodium: 140 mmol/L (ref 135–145)

## 2014-12-29 LAB — MAGNESIUM: MAGNESIUM: 1.7 mg/dL (ref 1.5–2.5)

## 2014-12-29 NOTE — Discharge Summary (Signed)
1 Day Post-Op  Subjective: He looks good, hurts to move, he is taking some clears.  Wants his foley out.  Objective: Vital signs in last 24 hours: Temp:  [98 F (36.7 C)-99.8 F (37.7 C)] 98.2 F (36.8 C) (01/14 0500) Pulse Rate:  [79-98] 83 (01/14 0500) Resp:  [12-18] 18 (01/14 0500) BP: (138-181)/(71-107) 138/87 mmHg (01/14 0500) SpO2:  [96 %-100 %] 98 % (01/14 0500) Weight:  [86.665 kg (191 lb 1 oz)] 86.665 kg (191 lb 1 oz) (01/14 0500) Last BM Date: 12/27/14 Afebrile, VSS Labs OK Intake/Output from previous day: 01/13 0701 - 01/14 0700 In: 5450 [I.V.:5400; IV Piggyback:50] Out: 2275 [Urine:2225; Blood:50] Intake/Output this shift:    General appearance: alert, cooperative and no distress Resp: clear to auscultation bilaterally GI: soft very tender and sore.  sites look good, + BS  Lab Results:   Recent Labs  12/28/14 0511 12/29/14 0530  WBC 4.6 10.0  HGB 13.7 13.7  HCT 40.6 40.4  PLT 170 173    BMET  Recent Labs  12/28/14 0511 12/29/14 0530  NA 139 140  K 3.4* 3.9  CL 109 106  CO2 24 25  GLUCOSE 103* 105*  BUN 5* 8  CREATININE 0.80 0.93  CALCIUM 8.7 8.8   PT/INR No results for input(s): LABPROT, INR in the last 72 hours.  No results for input(s): AST, ALT, ALKPHOS, BILITOT, PROT, ALBUMIN in the last 168 hours.   Lipase  No results found for: LIPASE   Studies/Results: No results found.  Medications: . acetaminophen  1,000 mg Oral TID  . alvimopan  12 mg Oral BID  . heparin subcutaneous  5,000 Units Subcutaneous 3 times per day  . lip balm  1 application Topical BID  . saccharomyces boulardii  250 mg Oral BID    Assessment/Plan Recurrent sigmoid volvulus LAPAROSCOPIC SIGMOID COLECTOMY PROCTOSCOPY, 12/28/2014, Karie SodaSteven Gross, MD Hypertension History of CVA Gout Arthritis Dyslipidemia  ED   Plan:  Mobilize, keep on clears for now, d/c foley.    LOS: 4 days    Kavin Weckwerth 12/29/2014

## 2014-12-29 NOTE — Progress Notes (Addendum)
Patient ID: Joseph [name], male   DOB: Aug 13, 1951, 64 y.o.   MRN: [id number] [facility] PROGRESS NOTE  Joseph [name] ION:[id number] DOB: Oct 11, 1951 DOA: 12/25/2014 PCP: No PCP Per Patient  Brief narrative:    64 yo male with past medical history of stroke, hypertension who presented to [facility] ED with progressively worsening abdominal pain with bloating for the past 2 days prior to this admission. Patient reported no bowel movements in the last 2 days. No associated nausea, vomiting or fevers. On admission, patient was noted to have sigmoid volvulus. Patient underwent decompression of sigmoid volvulus by GI on 12/26/2014. Patient underwent sigmoid colectomy 12/28/2014.   Assessment/Plan:     Sigmoid volvulus - s/p endocospy on 1.11.2016 with decompression of sigmoid volvulus.  - Patient is status post sigmoid colectomy 12/28/2014 with no subsequent complications. - Management per surgery    Essential Hypertension - may resume blood pressure medications on discharge   Hyperlipidemia - resume on discharge      DVT Prophylaxis  - SCD's bilaterally   Code Status: Full.  Family Communication: plan of care discussed with the patient    IV access:  Peripheral IV  Procedures and diagnostic studies:   Ct Abdomen Pelvis W Contrast 12/25/2014 1. Sigmoid colon dilatation with a relative abrupt transition point involving the mid -distal sigmoid colon most concerning for a sigmoid volvulus. There is a small amount of abdominal free fluid. Electronically Signed By: Elige Ko On: 12/25/2014 18:48   Dg Abd Acute W/chest 12/25/2014 There is marked distention of the colon which may be secondary to distal colonic obstruction. Colonic volvulus is not excluded. Scattered prominent loops of small bowel.      Medical Consultants:  Surgery  Gastroenterology  Other Consultants:  None   IAnti-Infectives:   Cefotetan 12/26/2014 --> 12/28/2014  Flagyl  12/26/2014 --> 12/28/2014    Manson Passey, MD  [facility] Pager [phone]  If 7PM-7AM, please contact night-coverage [email or web address] Password [facility] 12/29/2014, 10:42 AM   LOS: 4 days    HPI/Subjective: No acute overnight events.  Objective: Filed Vitals:   12/28/14 2021 12/28/14 2200 12/29/14 0230 12/29/14 0500  BP:  149/71 141/82 138/87  Pulse:  96 79 83  Temp: 99.8 F (37.7 C) 99.8 F (37.7 C) 99 F (37.2 C) 98.2 F (36.8 C)  TempSrc:  Oral Oral Oral  Resp:  Height:      Weight:    86.665 kg (191 lb 1 oz)  SpO2:  96% 99% 98%    Intake/Output Summary (Last 24 hours) at 12/29/14 1042 Last data filed at 12/29/14 0944  Gross per 24 hour  Intake   5570 ml  Output   2275 ml  Net   3295 ml    Exam:   General:  Pt is alert, follows commands appropriately, not in acute distress  Cardiovascular: Regular rate and rhythm, S1/S2, no murmurs  Respiratory: Clear to auscultation bilaterally, no wheezing, no crackles, no rhonchi  Abdomen: tender in mid abdomen, bowel sounds present  Extremities: No edema, pulses DP and PT palpable bilaterally  Neuro: Grossly nonfocal  Data Reviewed: Basic Metabolic Panel:  Recent Labs Lab 12/25/14 1720 12/26/14 0550 12/28/14 0511 12/29/14 0530  NA 138 141 139 140  K 3.6 3.6 3.4* 3.9  CL 104 109 109 106  CO2 GLUCOSE 90 90 103* 105*  BUN 18 16 5* 8  CREATININE 0.90 0.80 0.80 0.93  CALCIUM 8.9 8.6 8.7 8.8  MG  --   --   --  1.7   Liver Function Tests: No results for input(s): AST, ALT, ALKPHOS, BILITOT, PROT, ALBUMIN in the last 168 hours. No results for input(s): LIPASE, AMYLASE in the last 168 hours. No results for input(s): AMMONIA in the last 168 hours. CBC:  Recent Labs Lab 12/25/14 1528 12/25/14 1720 12/26/14 0550 12/28/14 0511 12/29/14 0530  WBC 8.3 8.8 6.3 4.6 10.0  NEUTROABS  --  6.3  --   --   --   HGB 15.1 14.8 13.7 13.7 13.7  HCT 45.7 43.9 41.6 40.6 40.4  MCV 89.6 89.4  90.2 88.3 88.4  PLT  --  187 171 170 173   Cardiac Enzymes: No results for input(s): CKTOTAL, CKMB, CKMBINDEX, TROPONINI in the last 168 hours. BNP: Invalid input(s): POCBNP CBG: No results for input(s): GLUCAP in the last 168 hours.  Recent Results (from the past 240 hour(s))  Surgical pcr screen     Status: None   Collection Time: 12/26/14  7:49 PM  Result Value Ref Range Status   MRSA, PCR NEGATIVE NEGATIVE Final   Staphylococcus aureus NEGATIVE NEGATIVE Final     Scheduled Meds: . acetaminophen  1,000 mg Oral TID  . alvimopan  12 mg Oral BID  . heparin subcutaneous  5,000 Units Subcutaneous 3 times per day  . lip balm  1 application Topical BID  . saccharomyces boulardii  250 mg Oral BID   Continuous Infusions: . sodium chloride 0.9 % 1,000 mL with potassium chloride 20 mEq infusion 100 mL/hr at 12/29/14 [id number]

## 2014-12-30 ENCOUNTER — Encounter: Payer: BC Managed Care – PPO | Admitting: Family Medicine

## 2014-12-30 MED ORDER — COLCHICINE 0.6 MG PO TABS
0.6000 mg | ORAL_TABLET | Freq: Every day | ORAL | Status: DC | PRN
  Filled 2014-12-30: qty 2

## 2014-12-30 MED ORDER — CLOBETASOL PROPIONATE 0.05 % EX CREA
1.0000 "application " | TOPICAL_CREAM | Freq: Two times a day (BID) | CUTANEOUS | Status: DC
  Administered 2014-12-30 – 2014-12-31 (×2): 1 via TOPICAL
  Filled 2014-12-30: qty 15

## 2014-12-30 MED ORDER — LISINOPRIL 20 MG PO TABS
20.0000 mg | ORAL_TABLET | Freq: Every day | ORAL | Status: DC
  Administered 2014-12-30 – 2014-12-31 (×2): 20 mg via ORAL
  Filled 2014-12-30 (×2): qty 1

## 2014-12-30 MED ORDER — PRAVASTATIN SODIUM 40 MG PO TABS
40.0000 mg | ORAL_TABLET | Freq: Every day | ORAL | Status: DC
  Administered 2014-12-30: 40 mg via ORAL
  Filled 2014-12-30 (×2): qty 1

## 2014-12-30 NOTE — Progress Notes (Signed)
Patient ID: Joseph [name], male   DOB: [birth year], 64 y.o.   MRN: [id number] [facility] PROGRESS NOTE  Joseph LutesOvido G [name] WUJ:[id number] DOB: [birth year] DOA: 12/25/2014 PCP: No PCP Per Patient  Brief narrative:    64 yo male with past medical history of stroke, hypertension who [facility] with progressively worsening abdominal pain with bloating for the past 2 days prior to this admission. Patient reported no bowel movements in the last 2 days. No associated nausea, vomiting or fevers. On admission, patient was noted to have sigmoid volvulus. Patient underwent decompression of sigmoid volvulus by GI on 12/26/2014. Patient underwent sigmoid colectomy 12/28/2014.   Assessment/Plan:     Sigmoid volvulus - s/p endocospy on 1.11.2016 with decompression of sigmoid volvulus.  - Patient is status post sigmoid colectomy 12/28/2014 with no subsequent complications. - Management per surgery  - continue pain control - agree with ambulation  Essential Hypertension - lisinopril on board. Awaiting administration.  Hyperlipidemia - stable on statin  DVT Prophylaxis  - SCD's bilaterally   Code Status: Full.  Family Communication: plan of care discussed with the patient    IV access:  Peripheral IV  Procedures and diagnostic studies:   Ct Abdomen Pelvis W Contrast 12/25/2014 1. Sigmoid colon dilatation with a relative abrupt transition point involving the mid -distal sigmoid colon most concerning for a sigmoid volvulus. There is a small amount of abdominal free fluid. Electronically Signed By: Elige KoHetal Patel On: 12/25/2014 18:48   Dg Abd Acute W/chest 12/25/2014 There is marked distention of the colon which may be secondary to distal colonic obstruction. Colonic volvulus is not excluded. Scattered prominent loops of small bowel.      Medical Consultants:  Surgery  Gastroenterology  Other Consultants:  None   IAnti-Infectives:   Cefotetan  12/26/2014 --> 12/28/2014  Flagyl 12/26/2014 --> 12/28/2014    Penny PiaVEGA, Kennidy Lamke, MD  [facility] Pager [phone]  If 7PM-7AM, please contact night-coverage [email or web address] Password [email or web address] 12/30/2014, 4:20 PM   LOS: 5 days    HPI/Subjective: No new complaints. Pt inquiring about discharge   Objective: Filed Vitals:   12/30/14 0500 12/30/14 0600 12/30/14 1400 12/30/14 1537  BP:  176/97 183/102 160/92  Pulse:  99 94   Temp:  98.8 F (37.1 C) 99 F (37.2 C)   TempSrc:  Oral Oral   Resp:  18 18   Height:      Weight: 85.531 kg (188 lb 9 oz)     SpO2:  98% 99%     Intake/Output Summary (Last 24 hours) at 12/30/14 1620 Last data filed at 12/30/14 1400  Gross per 24 hour  Intake   2500 ml  Output   4075 ml  Net  -1575 ml    Exam:   General:  Pt is alert and awake,  not in acute distress  Cardiovascular: Regular rate and rhythm, S1/S2, no murmurs  Respiratory: Clear to auscultation bilaterally, no wheezing, no crackles, no rhonchi  Abdomen: soft, bowel sounds present  Extremities: No edema  Neuro: answers questions appropriately, moves extremities equally  Data Reviewed: Basic Metabolic Panel:  Recent Labs Lab 12/25/14 1720 12/26/14 0550 12/28/14 0511 12/29/14 0530  NA 138 141 139 140  K 3.6 3.6 3.4* 3.9  CL 104 109 109 106  CO2 26 25 24 25   GLUCOSE 90 90 103* 105*  BUN 18 16 5* 8  CREATININE 0.90 0.80 0.80 0.93  CALCIUM 8.9 8.6 8.7 8.8  MG  --   --   --  1.7   Liver Function Tests: No results for input(s): AST, ALT, ALKPHOS, BILITOT, PROT, ALBUMIN in the last 168 hours. No results for input(s): LIPASE, AMYLASE in the last 168 hours. No results for input(s): AMMONIA in the last 168 hours. CBC:  Recent Labs Lab 12/25/14 1528 12/25/14 1720 12/26/14 0550 12/28/14 0511 12/29/14 0530  WBC 8.3 8.8 6.3 4.6 10.0  NEUTROABS  --  6.3  --   --   --   HGB 15.1 14.8 13.7 13.7 13.7  HCT 45.7 43.9 41.6 40.6 40.4  MCV 89.6 89.4 90.2 88.3 88.4  PLT  --   187 171 170 173   Cardiac Enzymes: No results for input(s): CKTOTAL, CKMB, CKMBINDEX, TROPONINI in the last 168 hours. BNP: Invalid input(s): POCBNP CBG: No results for input(s): GLUCAP in the last 168 hours.  Recent Results (from the past 240 hour(s))  Surgical pcr screen     Status: None   Collection Time: 12/26/14  7:49 PM  Result Value Ref Range Status   MRSA, PCR NEGATIVE NEGATIVE Final   Staphylococcus aureus NEGATIVE NEGATIVE Final     Scheduled Meds: . acetaminophen  1,000 mg Oral TID  . clobetasol cream  1 application Topical BID  . heparin subcutaneous  5,000 Units Subcutaneous 3 times per day  . lip balm  1 application Topical BID  . lisinopril  20 mg Oral Daily  . pravastatin  40 mg Oral QHS  . saccharomyces boulardii  250 mg Oral BID   Continuous Infusions: . sodium chloride 0.9 % 1,000 mL with potassium chloride 20 mEq infusion 75 mL/hr at 12/30/14 0950

## 2014-12-30 NOTE — Progress Notes (Signed)
2 Days Post-Op  Subjective: Feels bloated this AM. Passing some gas.  Ate a little of his full liquid diet.  Objective: Vital signs in last 24 hours: Temp:  [98.8 F (37.1 C)-99.3 F (37.4 C)] 98.8 F (37.1 C) (01/15 0600) Pulse Rate:  [80-99] 99 (01/15 0600) Resp:  [18] 18 (01/15 0600) BP: (146-176)/(83-97) 176/97 mmHg (01/15 0600) SpO2:  [97 %-99 %] 98 % (01/15 0600) Weight:  [188 lb 9 oz (85.531 kg)] 188 lb 9 oz (85.531 kg) (01/15 0500) Last BM Date: 12/29/14  Intake/Output from previous day: 01/14 0701 - 01/15 0700 In: 3626.4 [P.O.:480; I.V.:3146.4] Out: 2425 [Urine:2425] Intake/Output this shift:    PE: General- In NAD Abdomen-soft, distended, active bowel sounds, some evolving ecchmosis superior to lower dressing  Lab Results:   Recent Labs  12/28/14 0511 12/29/14 0530  WBC 4.6 10.0  HGB 13.7 13.7  HCT 40.6 40.4  PLT 170 173   BMET  Recent Labs  12/28/14 0511 12/29/14 0530  NA 139 140  K 3.4* 3.9  CL 109 106  CO2 24 25  GLUCOSE 103* 105*  BUN 5* 8  CREATININE 0.80 0.93  CALCIUM 8.7 8.8   PT/INR No results for input(s): LABPROT, INR in the last 72 hours. Comprehensive Metabolic Panel:    Component Value Date/Time   NA 140 12/29/2014 0530   NA 139 12/28/2014 0511   K 3.9 12/29/2014 0530   K 3.4* 12/28/2014 0511   CL 106 12/29/2014 0530   CL 109 12/28/2014 0511   CO2 25 12/29/2014 0530   CO2 24 12/28/2014 0511   BUN 8 12/29/2014 0530   BUN 5* 12/28/2014 0511   CREATININE 0.93 12/29/2014 0530   CREATININE 0.80 12/28/2014 0511   CREATININE 0.90 06/21/2014 1846   CREATININE 0.86 05/20/2014 1712   GLUCOSE 105* 12/29/2014 0530   GLUCOSE 103* 12/28/2014 0511   CALCIUM 8.8 12/29/2014 0530   CALCIUM 8.7 12/28/2014 0511   AST 31 06/21/2014 2205   AST 28 06/21/2014 1846   ALT 27 06/21/2014 2205   ALT 28 06/21/2014 1846   ALKPHOS 49 06/21/2014 2205   ALKPHOS 49 06/21/2014 1846   BILITOT 0.6 06/21/2014 2205   BILITOT 0.8 06/21/2014 1846   PROT 7.4 06/21/2014 2205   PROT 7.7 06/21/2014 1846   ALBUMIN 4.1 06/21/2014 2205   ALBUMIN 4.7 06/21/2014 1846     Studies/Results: No results found.  Anti-infectives: Anti-infectives    Start     Dose/Rate Route Frequency Ordered Stop   12/28/14 2300  cefoTEtan (CEFOTAN) 2 g in dextrose 5 % 50 mL IVPB     2 g100 mL/hr over 30 Minutes Intravenous Every 12 hours 12/28/14 1433 12/28/14 2318   12/28/14 1030  clindamycin (CLEOCIN) 900 mg, gentamicin (GARAMYCIN) 240 mg in sodium chloride 0.9 % 1,000 mL for intraperitoneal lavage      Intraperitoneal To Surgery 12/28/14 1019 12/28/14 1214   12/28/14 0600  cefoTEtan (CEFOTAN) 2 g in dextrose 5 % 50 mL IVPB  Status:  Discontinued    Comments:  Pharmacy may adjust dose strength for optimal dosing.   Send with patient on call to the OR.  Anesthesia to complete antibiotic administration <9360min prior to incision [facility]   2 g100 mL/hr over 30 Minutes Intravenous On call to O.R. 12/27/14 1442 12/27/14 1443   12/28/14 0600  cefoTEtan (CEFOTAN) 2 g in dextrose 5 % 50 mL IVPB     2 g100 mL/hr over 30 Minutes Intravenous On call  to O.R. 12/27/14 1443 12/28/14 1100   12/27/14 0600  cefoTEtan (CEFOTAN) 2 g in dextrose 5 % 50 mL IVPB  Status:  Discontinued     2 g100 mL/hr over 30 Minutes Intravenous On call to O.R. 12/26/14 0741 12/27/14 1443   12/26/14 2300  neomycin (MYCIFRADIN) tablet 1,000 mg  Status:  Discontinued     1,000 mg Oral 3 times per day 12/26/14 0741 12/26/14 0806   12/26/14 2300  metroNIDAZOLE (FLAGYL) tablet 1,000 mg  Status:  Discontinued     1,000 mg Oral 3 times per day 12/26/14 0741 12/26/14 0806   12/26/14 1500  neomycin (MYCIFRADIN) tablet 1,000 mg  Status:  Discontinued     1,000 mg Oral 3 times per day 12/26/14 0741 12/26/14 0806   12/26/14 1500  metroNIDAZOLE (FLAGYL) tablet 1,000 mg  Status:  Discontinued     1,000 mg Oral 3 times per day 12/26/14 0741 12/26/14 0806   12/26/14 1300  neomycin (MYCIFRADIN) tablet  1,000 mg  Status:  Discontinued     1,000 mg Oral 3 times per day 12/26/14 0741 12/26/14 0806   12/26/14 1300  metroNIDAZOLE (FLAGYL) tablet 1,000 mg  Status:  Discontinued     1,000 mg Oral 3 times per day 12/26/14 0741 12/26/14 0806   12/26/14 0900  neomycin (MYCIFRADIN) tablet 1,000 mg    Comments:  by mouth at 2pm, 3pm, and 10pm the day before the colorectal operation   1,000 mg Oral 3 times per day 12/26/14 0806 12/26/14 1018   12/26/14 0900  metroNIDAZOLE (FLAGYL) tablet 1,000 mg    Comments:  by mouth at 2pm, 3pm, and 10pm the day before the colorectal operation, 1/11   1,000 mg Oral 3 times per day 12/26/14 0806 12/26/14 1018   12/26/14 0815  cefoTEtan (CEFOTAN) 2 g in dextrose 5 % 50 mL IVPB     2 g100 mL/hr over 30 Minutes Intravenous On call to O.R. 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  Ambulate.  Decrease IVF.   Alaysia Lightle J 12/30/2014

## 2014-12-31 MED ORDER — LORAZEPAM 0.5 MG PO TABS
0.5000 mg | ORAL_TABLET | Freq: Once | ORAL | Status: AC
  Administered 2014-12-31: 0.5 mg via ORAL
  Filled 2014-12-31: qty 1

## 2014-12-31 MED ORDER — AMLODIPINE BESYLATE 5 MG PO TABS
5.0000 mg | ORAL_TABLET | Freq: Every day | ORAL | Status: DC
  Filled 2014-12-31: qty 1

## 2014-12-31 MED ORDER — HYDROXYZINE HCL 25 MG PO TABS: 25.0000 mg | ORAL_TABLET | Freq: Three times a day (TID) | ORAL | Status: AC | PRN

## 2014-12-31 MED ORDER — HYDROXYZINE HCL 25 MG PO TABS: 25.0000 mg | ORAL_TABLET | Freq: Three times a day (TID) | ORAL | Status: DC | PRN

## 2014-12-31 MED ORDER — HYDROCODONE-ACETAMINOPHEN 5-325 MG PO TABS: 1.0000 | ORAL_TABLET | ORAL | Status: DC | PRN

## 2014-12-31 MED ORDER — HYDROXYZINE HCL 25 MG PO TABS
25.0000 mg | ORAL_TABLET | Freq: Three times a day (TID) | ORAL | Status: DC | PRN
  Filled 2014-12-31: qty 1

## 2014-12-31 NOTE — Discharge Summary (Signed)
Reviewed discharge instructions with pt including follow-up appointments, medications, incision care, and precautions.  Pt verbalized understanding after questions answered.  Pt being d/c to home into care of spouse.

## 2014-12-31 NOTE — Progress Notes (Signed)
Patient ID: Joseph [name], male   DOB: Sep 05, 1951, 64 y.o.   MRN: [id number] [facility] PROGRESS NOTE  Joseph [name] WUJ:[id number] DOB: 04/11/1951 DOA: 12/25/2014 PCP: No PCP Per Patient  Brief narrative:    64 yo male with past medical history of stroke, hypertension who presented to [facility] ED with progressively worsening abdominal pain with bloating for the past 2 days prior to this admission. Patient reported no bowel movements in the last 2 days. No associated nausea, vomiting or fevers. On admission, patient was noted to have sigmoid volvulus. Patient underwent decompression of sigmoid volvulus by GI on 12/26/2014. Patient underwent sigmoid colectomy 12/28/2014.   Assessment/Plan:     Sigmoid volvulus - s/p endocospy on 1.11.2016 with decompression of sigmoid volvulus.  - Patient is status post sigmoid colectomy 12/28/2014 with no subsequent complications. - Pt to be d/c'd today  Essential Hypertension - lisinopril on board. - recommended patient f/u with primary care physician for further evaluation and recommendations  Hyperlipidemia - stable on statin  Anxiety - d/c on Hydroxyzine   DVT Prophylaxis  - SCD's bilaterally   Code Status: Full.  Family Communication: plan of care discussed with the patient Disposition: Pt to be discharged today    IV access:  Peripheral IV  Procedures and diagnostic studies:   Ct Abdomen Pelvis W Contrast 12/25/2014 1. Sigmoid colon dilatation with a relative abrupt transition point involving the mid -distal sigmoid colon most concerning for a sigmoid volvulus. There is a small amount of abdominal free fluid. Electronically Signed By: Joseph [name] On: 12/25/2014 18:48   Dg Abd Acute W/chest 12/25/2014 There is marked distention of the colon which may be secondary to distal colonic obstruction. Colonic volvulus is not excluded. Scattered prominent loops of small bowel.      Medical Consultants:   Surgery  Gastroenterology  Other Consultants:  None   IAnti-Infectives:   Cefotetan 12/26/2014 --> 12/28/2014  Flagyl 12/26/2014 --> 12/28/2014    Joseph Pia, MD  [facility] Pager [phone]  If 7PM-7AM, please contact night-coverage [email or web address] Password Joseph [name] 12/31/2014, 2:18 PM   LOS: 6 days    HPI/Subjective: No new complaints. Pt complaining of anxiety  Objective: Filed Vitals:   12/30/14 1537 12/30/14 1909 12/31/14 0556 12/31/14 0900  BP: 160/92 150/90 181/98 157/75  Pulse:  98 98 117  Temp:   98.7 F (37.1 C) 98.5 F (36.9 C)  TempSrc:   Oral Oral  Resp:   18 18  Height:      Weight:    84.188 kg (185 lb 9.6 oz)  SpO2:   98% 98%    Intake/Output Summary (Last 24 hours) at 12/31/14 1418 Last data filed at 12/31/14 1000  Gross per 24 hour  Intake    240 ml  Output   2150 ml  Net  -1910 ml    Exam:   General:  Pt is alert and awake,  not in acute distress  Cardiovascular: Regular rate and rhythm, S1/S2, no murmurs  Respiratory: Clear to auscultation bilaterally, no wheezing, no crackles, no rhonchi  Abdomen: soft, bowel sounds present  Extremities: No edema  Neuro: answers questions appropriately, moves extremities equally  Data Reviewed: Basic Metabolic Panel:  Recent Labs Lab 12/25/14 1720 12/26/14 0550 12/28/14 0511 12/29/14 0530  NA 138 141 139 140  K 3.6 3.6 3.4* 3.9  CL 104 109 109 106  CO2 GLUCOSE 90 90 103* 105*  BUN 18 16 5* 8  CREATININE 0.90  0.80 0.80 0.93  CALCIUM 8.9 8.6 8.7 8.8  MG  --   --   --  1.7   Liver Function Tests: No results for input(s): AST, ALT, ALKPHOS, BILITOT, PROT, ALBUMIN in the last 168 hours. No results for input(s): LIPASE, AMYLASE in the last 168 hours. No results for input(s): AMMONIA in the last 168 hours. CBC:  Recent Labs Lab 12/25/14 1528 12/25/14 1720 12/26/14 0550 12/28/14 0511 12/29/14 0530  WBC 8.3 8.8 6.3 4.6 10.0  NEUTROABS  --  6.3  --   --    --   HGB 15.1 14.8 13.7 13.7 13.7  HCT 45.7 43.9 41.6 40.6 40.4  MCV 89.6 89.4 90.2 88.3 88.4  PLT  --  187 171 170 173   Cardiac Enzymes: No results for input(s): CKTOTAL, CKMB, CKMBINDEX, TROPONINI in the last 168 hours. BNP: Invalid input(s): POCBNP CBG: No results for input(s): GLUCAP in the last 168 hours.  Recent Results (from the past 240 hour(s))  Surgical pcr screen     Status: None   Collection Time: 12/26/14  7:49 PM  Result Value Ref Range Status   MRSA, PCR NEGATIVE NEGATIVE Final   Staphylococcus aureus NEGATIVE NEGATIVE Final     Scheduled Meds: . acetaminophen  1,000 mg Oral TID  . amLODipine  5 mg Oral Daily  . clobetasol cream  1 application Topical BID  . heparin subcutaneous  5,000 Units Subcutaneous 3 times per day  . lip balm  1 application Topical BID  . lisinopril  20 mg Oral Daily  . pravastatin  40 mg Oral QHS  . saccharomyces boulardii  250 mg Oral BID   Continuous Infusions: . sodium chloride 0.9 % 1,000 mL with potassium chloride 20 mEq infusion 75 mL/hr at 12/30/14 0950

## 2015-01-02 ENCOUNTER — Other Ambulatory Visit (INDEPENDENT_AMBULATORY_CARE_PROVIDER_SITE_OTHER): Payer: Self-pay

## 2015-02-27 ENCOUNTER — Ambulatory Visit (HOSPITAL_BASED_OUTPATIENT_CLINIC_OR_DEPARTMENT_OTHER): Payer: BLUE CROSS/BLUE SHIELD | Attending: Family Medicine | Admitting: Radiology

## 2015-02-27 VITALS — Ht 70.0 in | Wt 193.0 lb

## 2015-02-27 DIAGNOSIS — R0683 Snoring: Secondary | ICD-10-CM | POA: Insufficient documentation

## 2015-02-27 DIAGNOSIS — R400 Somnolence: Secondary | ICD-10-CM | POA: Insufficient documentation

## 2015-03-04 DIAGNOSIS — G4730 Sleep apnea, unspecified: Secondary | ICD-10-CM

## 2015-03-04 NOTE — Sleep Study (Signed)
   NAME: Joseph [name] DATE OF BIRTH:  1951-02-25 MEDICAL RECORD NUMBER [id number]  LOCATION: [facility]  PHYSICIAN: Jera Headings D  DATE OF STUDY: 02/27/2015  SLEEP STUDY TYPE: Nocturnal Polysomnogram               REFERRING PHYSICIAN: Copland, Gwenlyn FoundJessica C, MD  INDICATION FOR STUDY: Hypersomnia with sleep apnea. Sleep paralysis.  EPWORTH SLEEPINESS SCORE:   13/24 HEIGHT: 5\' 10"  (177.8 cm)  WEIGHT: 193 lb (87.544 kg)    Body mass index is 27.69 kg/(m^2).  NECK SIZE: 16.5 in.  MEDICATIONS: Charted for review  SLEEP ARCHITECTURE: Total sleep time 324 minutes with sleep efficiency 90.4%. Stage I was 13%, stage II 73%, stage III absent, REM 14% of total sleep time. Sleep latency 0 minutes, REM latency 54.5 minutes, awake after sleep onset 35.5 minutes, arousal index 16.3, bedtime medication: None  RESPIRATORY DATA: Apnea hypopnea index (AHI) 42.6 per hour. 230 total events were scored including 153 obstructive apneas, 9 central apneas, 7 mixed apneas, 61 hypopneas. All events were while sleeping supine. REM AHI 50.1 per hour. This study was ordered as a diagnostic polysomnogram without CPAP.  OXYGEN DATA: Mild to moderately loud snoring with oxygen desaturation to a nadir of 69% and mean saturation 93.8% on room air.  CARDIAC DATA: Sinus rhythm with occasional PAC  MOVEMENT/PARASOMNIA: Occasional limb jerks with no apparent effect on sleep. Bathroom 1  IMPRESSION/ RECOMMENDATION:   1) Severe obstructive sleep apnea/hypopnea syndrome, AHI 42.6 per hour with supine events. REM AHI 50.1 per hour. Mild to moderately loud snoring with oxygen desaturation to a nadir of 69% and mean saturation 93.8% on room air. 2) This study was ordered as a diagnostic polysomnogram without CPAP titration. The patient can return for dedicated CPAP titration study if appropriate.   Waymon BudgeYOUNG,Delaynee Alred D Diplomate, American Board of Sleep Medicine  ELECTRONICALLY SIGNED ON:  03/04/2015,  10:03 AM [facility] PH: [phone]   FX: [phone] ACCREDITED BY THE AMERICAN ACADEMY OF SLEEP MEDICINE

## 2015-04-03 ENCOUNTER — Other Ambulatory Visit: Payer: Self-pay | Admitting: Family Medicine

## 2015-04-26 ENCOUNTER — Other Ambulatory Visit: Payer: Self-pay | Admitting: Family Medicine

## 2015-04-29 ENCOUNTER — Ambulatory Visit (INDEPENDENT_AMBULATORY_CARE_PROVIDER_SITE_OTHER): Payer: BLUE CROSS/BLUE SHIELD | Admitting: Physician Assistant

## 2015-04-29 VITALS — BP 150/96 | HR 72 | Temp 98.4°F | Ht 68.5 in | Wt 193.5 lb

## 2015-04-29 DIAGNOSIS — Z113 Encounter for screening for infections with a predominantly sexual mode of transmission: Secondary | ICD-10-CM | POA: Diagnosis not present

## 2015-04-29 DIAGNOSIS — Z0000 Encounter for general adult medical examination without abnormal findings: Secondary | ICD-10-CM

## 2015-04-29 DIAGNOSIS — F329 Major depressive disorder, single episode, unspecified: Secondary | ICD-10-CM | POA: Diagnosis not present

## 2015-04-29 DIAGNOSIS — Z130 Encounter for screening for diseases of the blood and blood-forming organs and certain disorders involving the immune mechanism: Secondary | ICD-10-CM | POA: Diagnosis not present

## 2015-04-29 DIAGNOSIS — Z1329 Encounter for screening for other suspected endocrine disorder: Secondary | ICD-10-CM | POA: Diagnosis not present

## 2015-04-29 DIAGNOSIS — E785 Hyperlipidemia, unspecified: Secondary | ICD-10-CM | POA: Diagnosis not present

## 2015-04-29 DIAGNOSIS — I10 Essential (primary) hypertension: Secondary | ICD-10-CM | POA: Diagnosis not present

## 2015-04-29 DIAGNOSIS — Z125 Encounter for screening for malignant neoplasm of prostate: Secondary | ICD-10-CM

## 2015-04-29 DIAGNOSIS — F32A Depression, unspecified: Secondary | ICD-10-CM

## 2015-04-29 DIAGNOSIS — L918 Other hypertrophic disorders of the skin: Secondary | ICD-10-CM | POA: Diagnosis not present

## 2015-04-29 LAB — CBC
HEMATOCRIT: 43.5 % (ref 39.0–52.0)
Hemoglobin: 15 g/dL (ref 13.0–17.0)
MCH: 30 pg (ref 26.0–34.0)
MCHC: 34.5 g/dL (ref 30.0–36.0)
MCV: 87 fL (ref 78.0–100.0)
MPV: 10 fL (ref 8.6–12.4)
Platelets: 206 10*3/uL (ref 150–400)
RBC: 5 MIL/uL (ref 4.22–5.81)
RDW: 13.3 % (ref 11.5–15.5)
WBC: 6.5 10*3/uL (ref 4.0–10.5)

## 2015-04-29 LAB — COMPREHENSIVE METABOLIC PANEL
ALT: 28 U/L (ref 0–53)
AST: 24 U/L (ref 0–37)
Albumin: 4.3 g/dL (ref 3.5–5.2)
Alkaline Phosphatase: 47 U/L (ref 39–117)
BUN: 28 mg/dL — AB (ref 6–23)
CALCIUM: 9.5 mg/dL (ref 8.4–10.5)
CHLORIDE: 105 meq/L (ref 96–112)
CO2: 25 mEq/L (ref 19–32)
CREATININE: 0.9 mg/dL (ref 0.50–1.35)
GLUCOSE: 111 mg/dL — AB (ref 70–99)
POTASSIUM: 4.4 meq/L (ref 3.5–5.3)
SODIUM: 140 meq/L (ref 135–145)
Total Bilirubin: 0.5 mg/dL (ref 0.2–1.2)
Total Protein: 7.1 g/dL (ref 6.0–8.3)

## 2015-04-29 LAB — LIPID PANEL
Cholesterol: 170 mg/dL (ref 0–200)
HDL: 68 mg/dL (ref >=40)
LDL CALC: 89 mg/dL (ref 0–99)
Total CHOL/HDL Ratio: 2.5 Ratio
Triglycerides: 63 mg/dL (ref <150)
VLDL: 13 mg/dL (ref 0–40)

## 2015-04-29 LAB — RPR

## 2015-04-29 LAB — HIV ANTIBODY (ROUTINE TESTING W REFLEX): HIV 1&2 Ab, 4th Generation: NONREACTIVE

## 2015-04-29 LAB — TSH: TSH: 0.614 u[IU]/mL (ref 0.350–4.500)

## 2015-04-29 MED ORDER — PRAVASTATIN SODIUM 40 MG PO TABS
40.0000 mg | ORAL_TABLET | Freq: Every day | ORAL | Status: AC
Start: 2015-04-29 — End: ?

## 2015-04-29 MED ORDER — ESCITALOPRAM OXALATE 10 MG PO TABS: 10.0000 mg | ORAL_TABLET | Freq: Every day | ORAL | Status: AC

## 2015-04-29 MED ORDER — LISINOPRIL 20 MG PO TABS: 40.0000 mg | ORAL_TABLET | Freq: Every day | ORAL | Status: DC

## 2015-04-29 MED ORDER — AMLODIPINE BESYLATE 5 MG PO TABS: 5.0000 mg | ORAL_TABLET | Freq: Every day | ORAL | Status: AC

## 2015-04-29 NOTE — Progress Notes (Signed)
Subjective:    Patient ID: Joseph [name], male    DOB: 06-Jun-1951, 64 y.o.   MRN: [id number]  HPI Patient presents for CPE, depression, skin tag removal, and medication refill.   Has felt that over the past 2 months he has been more depressed. Is sad due to family issues. Due to his flirtatious nature wife left him 2 years ago. He came home and his wife and children were gone. He is Joseph [name] and Catholic and those that he knows think that he disgraced his family. To add insult, one day when his daughter saw him out she took his car. According to patient since he had nothing else his friend took him in then eventually became his girlfriend. Now his family wants him to come back, but he is torn between doing what is honorable and staying with the woman that took care of him and he now loves. Tried to go home for 3 days, but was miserable so went back to girlfriend. States that he is troubled daily, but is still doing his daily activities and going to work. Is not sleeping more than usual. Denies HI/SI, anxiety, or behavioral changes. Does not want to speak with pastor about current situation.  Has multiple skin tags, but one on left cheek has lightened in color. Has not grown in size, but would like it removed. Is not painful.  Has been out of amlodipine, lisinopril, and pravastatin for several weeks. Medication was well tolerated when he was taking. Has tried to eat a diet that is low in salt and high in fiber. Also eats plenty of vegetables. Walks 30 minutes daily. Is also active with work as a Production designer, theatre/television/filmcable assembler for the past 4 years. 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 Had to give business up when he had his stroke 4 years ago. Denies HA/dizziness, change in vision, SOB/CP, edema, or palpitations.  Health Maintenance: Had eye and dental exam this year. Colonoscopy Jan. 2016 secondary to volvulus surgery.   Review of Systems    Constitutional: Negative.  Negative for fever, activity change, appetite change and fatigue.  HENT: Negative.   Eyes: Negative.  Negative for photophobia and visual disturbance.  Respiratory: Negative.  Negative for cough, shortness of breath and wheezing.   Cardiovascular: Negative.  Negative for chest pain and leg swelling.  Gastrointestinal: Negative.  Negative for nausea, vomiting, abdominal pain, diarrhea, constipation and blood in stool.  Endocrine: Negative.   Genitourinary: Negative.  Negative for dysuria, frequency, hematuria, discharge, penile swelling and scrotal swelling.  Musculoskeletal: Negative.  Negative for myalgias, back pain, joint swelling, arthralgias and gait problem.  Skin: Positive for color change (skin tag).  Allergic/Immunologic: Positive for environmental allergies. Negative for food allergies.  Neurological: Negative for dizziness, seizures, syncope, weakness, light-headedness, numbness and headaches.  Psychiatric/Behavioral: Positive for dysphoric mood. Negative for suicidal ideas, behavioral problems, sleep disturbance, self-injury and agitation. The patient is not nervous/anxious.       Objective:   Physical Exam  Constitutional: He is oriented to person, place, and time. He appears well-developed and well-nourished. No distress.  Blood pressure 150/96, pulse 72, temperature 98.4 F (36.9 C), temperature source Oral, height 5' 8.5" (1.74 m), weight 193 lb 8 oz (87.771 kg), SpO2 99 %.  HENT:  Head: Normocephalic and atraumatic.  Right Ear: Tympanic membrane, external ear and ear canal normal.  Left Ear: Tympanic membrane, external ear and ear canal normal.  Nose:  Nose normal.  Mouth/Throat: Uvula is midline, oropharynx is clear and moist and mucous membranes are normal. No oropharyngeal exudate.  Eyes: Conjunctivae and EOM are normal. Pupils are equal, round, and reactive to light. Right eye exhibits no discharge. Left eye exhibits no discharge. No scleral  icterus.  Neck: Normal range of motion. Neck supple. No thyromegaly present.  Cardiovascular: Normal rate, regular rhythm, normal heart sounds and intact distal pulses.  Exam reveals no gallop and no friction rub.   No murmur heard. Pulmonary/Chest: Effort normal and breath sounds normal. No respiratory distress. He has no wheezes. He has no rales.  Abdominal: Soft. Bowel sounds are normal. He exhibits no distension. There is no tenderness. There is no rebound and no guarding.  Musculoskeletal: Normal range of motion. He exhibits no edema or tenderness.  Lymphadenopathy:    He has no cervical adenopathy.  Neurological: He is alert and oriented to person, place, and time. He has normal strength and normal reflexes. No cranial nerve deficit or sensory deficit. He exhibits normal muscle tone. Coordination normal.  Skin: Skin is warm and dry. No rash noted. He is not diaphoretic. No erythema. No pallor.  Psychiatric: His speech is normal and behavior is normal. Thought content normal. His mood appears not anxious. His affect is not angry. He is not agitated, not aggressive, not hyperactive, not slowed, not withdrawn, not actively hallucinating and not combative. He exhibits a depressed mood. He is attentive.       Assessment & Plan:  1. Physical exam, annual Age anticipatory guidance given.  2. Essential hypertension Lifestyle modifications discussed. - lisinopril (PRINIVIL,ZESTRIL) 20 MG tablet; Take 2 tablets (40 mg total) by mouth daily.  Dispense: 30 tablet; Refill: 3 - amLODipine (NORVASC) 5 MG tablet; Take 1 tablet (5 mg total) by mouth daily.  Dispense: 90 tablet; Refill: 3 - Comprehensive metabolic panel  3. Dyslipidemia - pravastatin (PRAVACHOL) 40 MG tablet; Take 1 tablet (40 mg total) by mouth at bedtime.  Dispense: 90 tablet; Refill: 1 - Lipid panel  4. Depression RTC in 4-6 weeks to eval how doing on medication and if any improvement to depression and/or home life. -  escitalopram (LEXAPRO) 10 MG tablet; Take 1 tablet (10 mg total) by mouth daily.  Dispense: 30 tablet; Refill: 1 - Ambulatory referral to Psychiatry  5. Screening for deficiency anemia - CBC  6. Screening for prostate cancer - PSA  7. Screening for thyroid disorder - TSH  8. Screen for STD (sexually transmitted disease) - GC/Chlamydia Probe Amp - RPR - HIV antibody  9. Skin tag - Dermatology pathology   Janan Ridgeishira Clements Toro PA-C  [facility] 04/29/2015 9:45 AM

## 2015-04-29 NOTE — Patient Instructions (Signed)

## 2015-05-01 LAB — PSA: PSA: 3.33 ng/mL (ref <=4.00)

## 2015-05-02 ENCOUNTER — Encounter: Payer: Self-pay | Admitting: Physician Assistant

## 2015-05-02 LAB — GC/CHLAMYDIA PROBE AMP
CT Probe RNA: NEGATIVE
GC Probe RNA: NEGATIVE

## 2015-06-05 ENCOUNTER — Other Ambulatory Visit: Payer: Self-pay | Admitting: Family Medicine

## 2015-10-04 ENCOUNTER — Other Ambulatory Visit: Payer: Self-pay

## 2015-10-04 DIAGNOSIS — I10 Essential (primary) hypertension: Secondary | ICD-10-CM

## 2015-10-04 MED ORDER — LISINOPRIL 20 MG PO TABS: 40.0000 mg | ORAL_TABLET | Freq: Every day | ORAL | Status: AC

## 2016-03-13 ENCOUNTER — Other Ambulatory Visit: Payer: Self-pay | Admitting: Family Medicine

## 2016-06-27 IMAGING — CR DG ABDOMEN 2V
2 series · 2 of 2 positions shown · non-contrast
Comparison: June 21, 2014.

CLINICAL DATA: Abdominal pain.

EXAM:
ABDOMEN - 2 VIEW

[w abdomen upright]
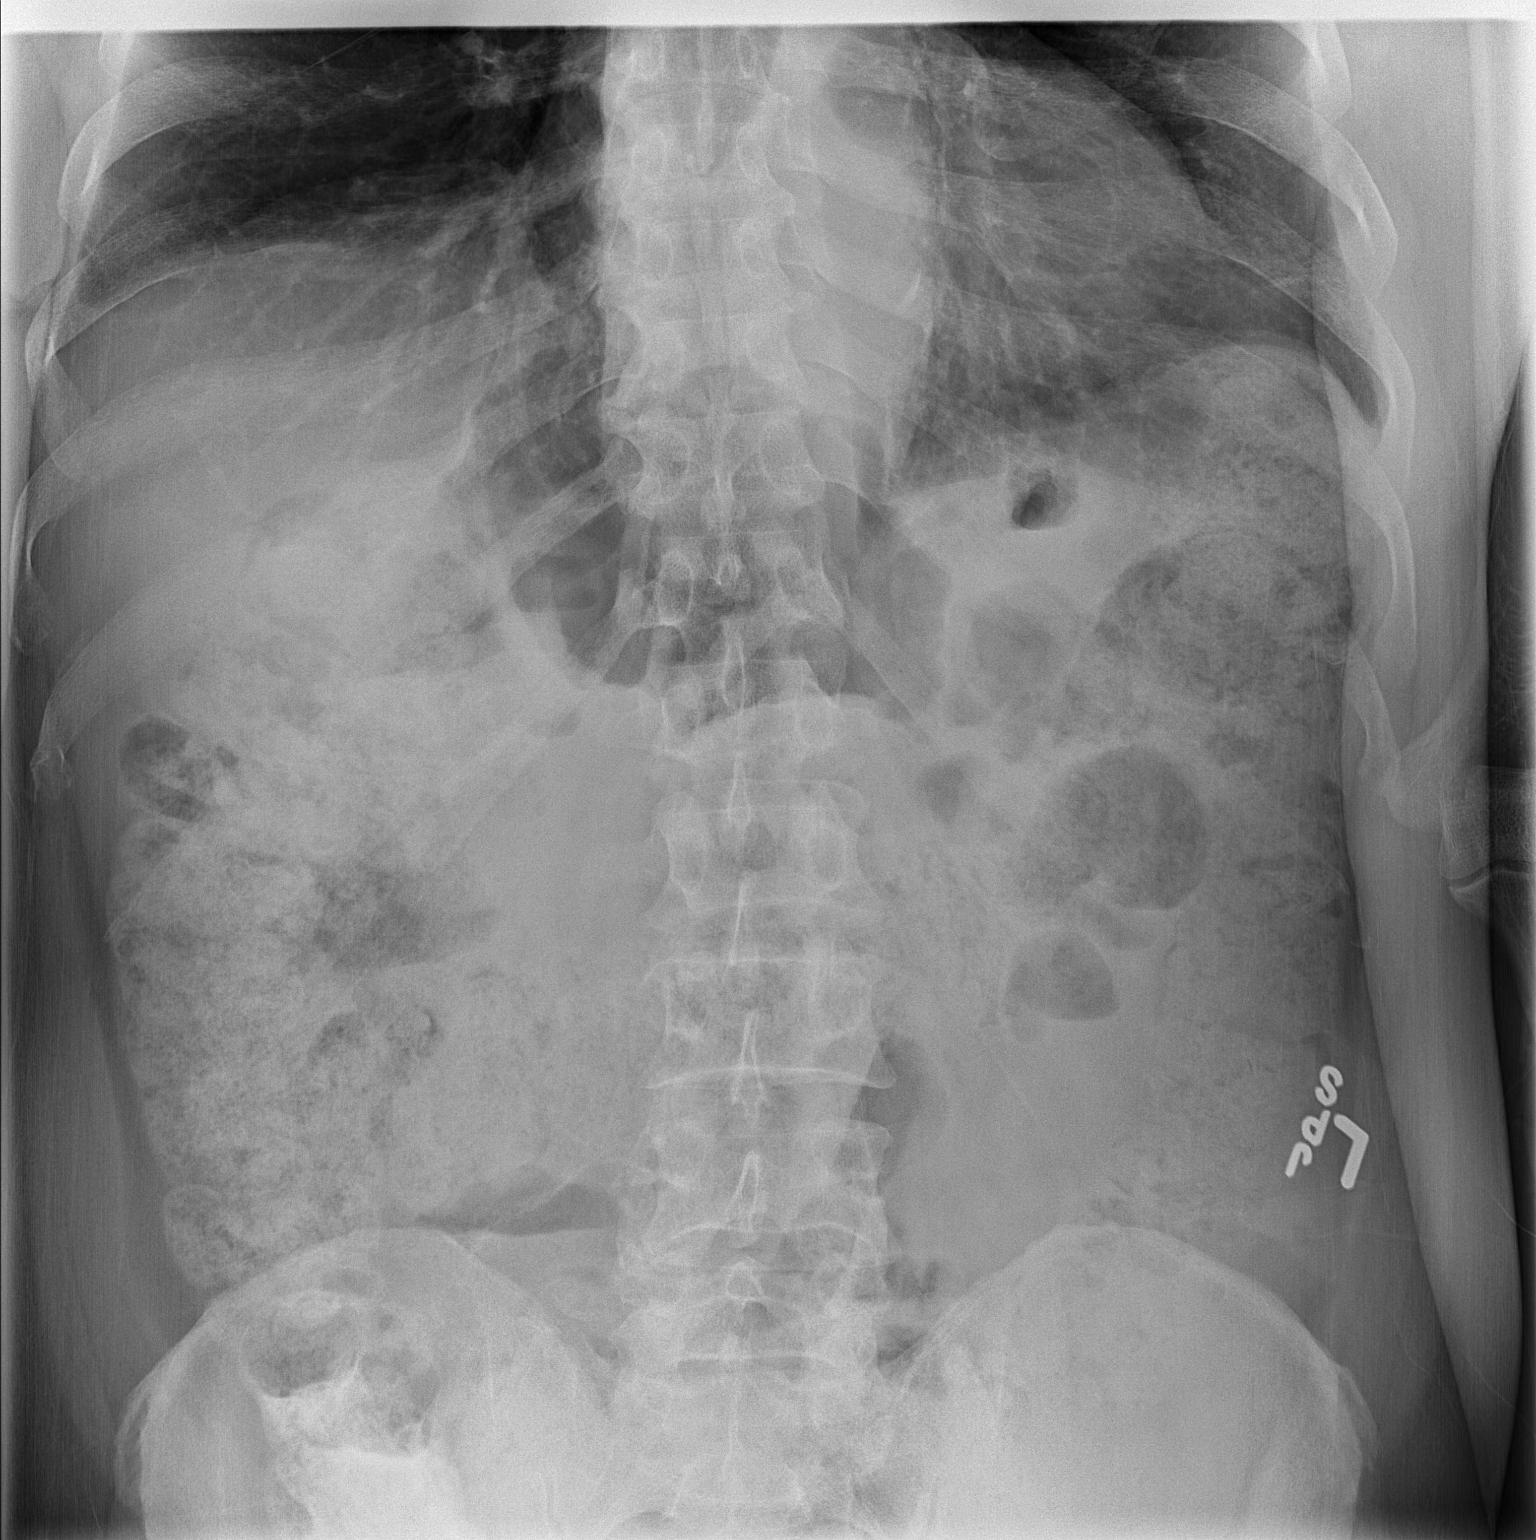

[t abdomen supine]
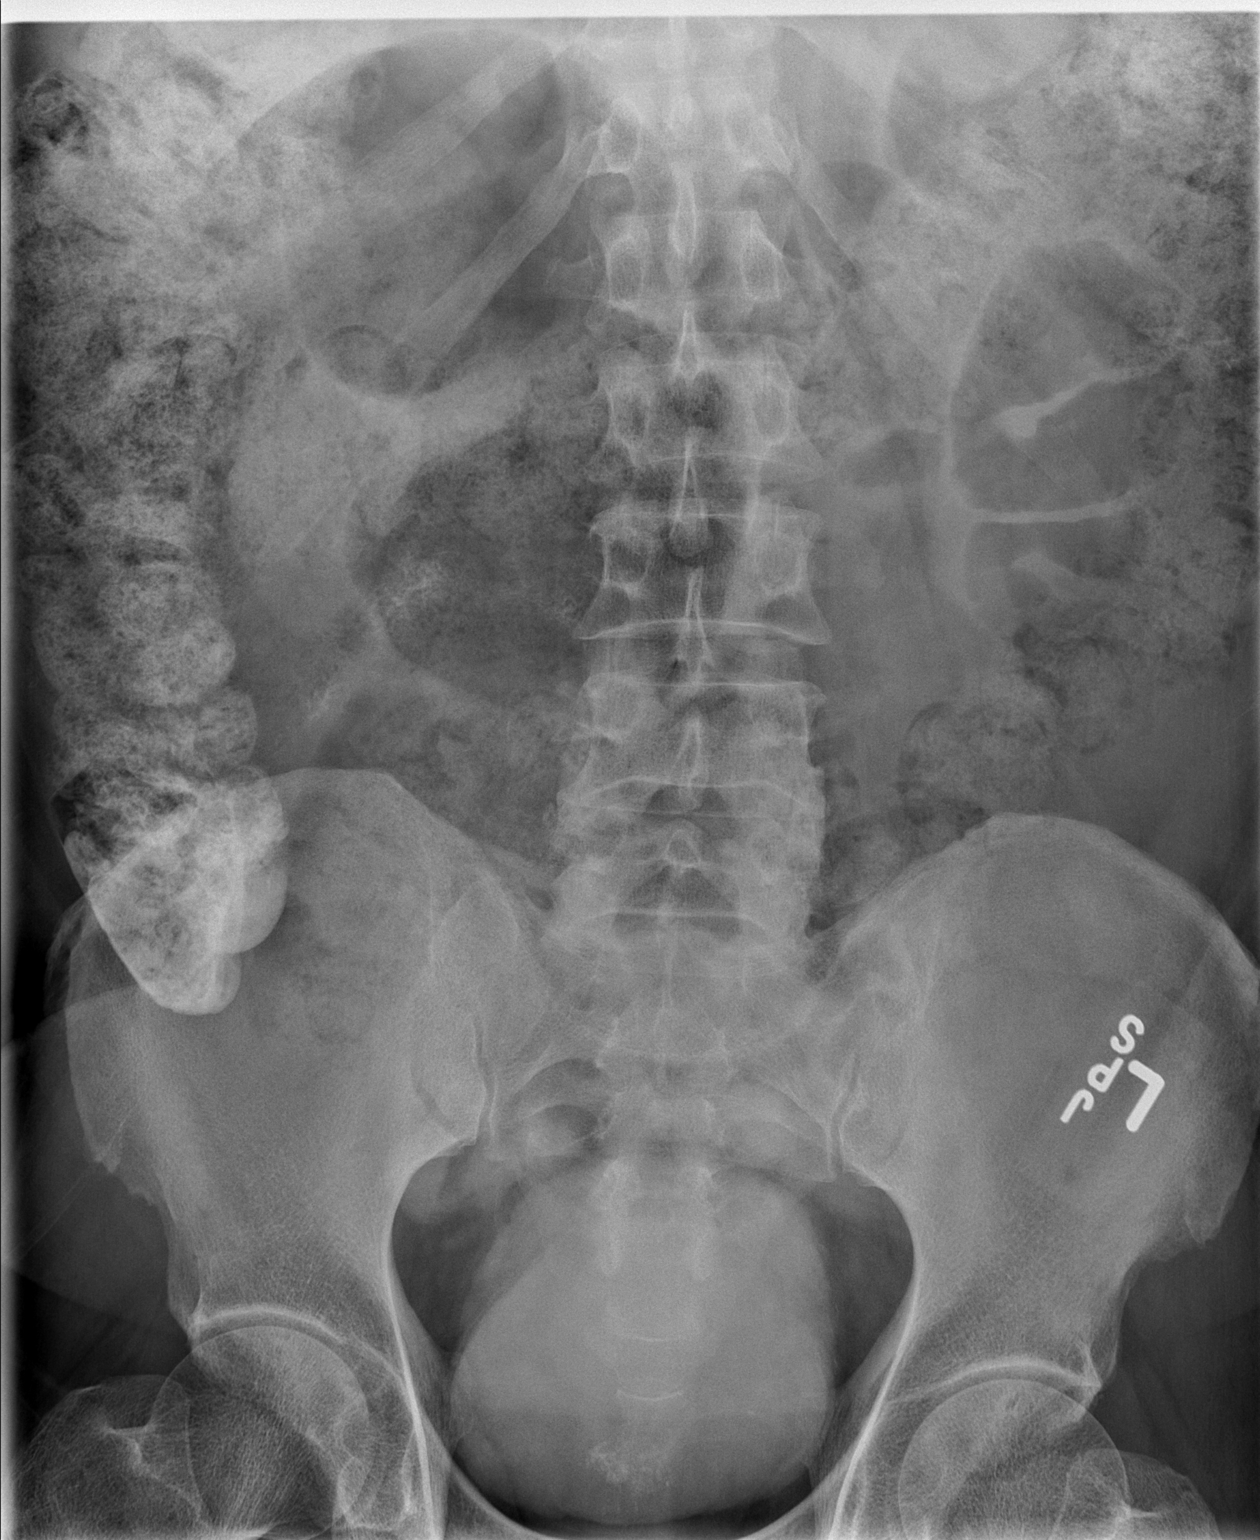

[2 of 2 positions shown; findings below may reference images not displayed]

FINDINGS: No abnormal bowel dilatation is noted. Stool is noted throughout the
colon. Dilated bowel noted on prior exam appears to have resolved.
Residual contrast is noted in the urinary bladder. There is no
evidence of free air. No radio-opaque calculi or other significant
radiographic abnormality is seen.
IMPRESSION: Dilated large bowel noted on prior exam is significantly improved.
Large amount of stool is now seen throughout the colon concerning
for possible constipation.

## 2020-01-29 ENCOUNTER — Ambulatory Visit: Payer: BLUE CROSS/BLUE SHIELD | Attending: Internal Medicine

## 2020-01-29 DIAGNOSIS — Z23 Encounter for immunization: Secondary | ICD-10-CM | POA: Insufficient documentation

## 2020-01-29 NOTE — Progress Notes (Signed)
   Covid-19 Vaccination Clinic  Name:  Joseph [name]    MRN: [id number] DOB: 30-Nov-1951  01/29/2020  [name] was observed post Covid-19 immunization for 15 minutes without incidence. He was provided with Vaccine Information Sheet and instruction to access the V-Safe system.   [name] was instructed to call 911 with any severe reactions post vaccine: Marland Kitchen Difficulty breathing  . Swelling of your face and throat  . A fast heartbeat  . A bad rash all over your body  . Dizziness and weakness    Immunizations Administered    Name Date Dose VIS Date Route   Pfizer COVID-19 Vaccine 01/29/2020  8:15 AM 0.3 mL 11/26/2019 Intramuscular   Manufacturer: [employer], [employer]   Lot: [id number]   NDC: [address]-4

## 2020-02-20 ENCOUNTER — Ambulatory Visit: Payer: BLUE CROSS/BLUE SHIELD | Attending: Internal Medicine

## 2020-02-20 DIAGNOSIS — Z23 Encounter for immunization: Secondary | ICD-10-CM

## 2020-02-20 NOTE — Progress Notes (Signed)
   Covid-19 Vaccination Clinic  Name:  Joseph [name]    MRN: [id number] DOB: March 12, 1951  02/20/2020  [name] was observed post Covid-19 immunization for 15 minutes without incident. He was provided with Vaccine Information Sheet and instruction to access the V-Safe system.   [name] was instructed to call 911 with any severe reactions post vaccine: Marland Kitchen Difficulty breathing  . Swelling of face and throat  . A fast heartbeat  . A bad rash all over body  . Dizziness and weakness   Immunizations Administered    Name Date Dose VIS Date Route   Pfizer COVID-19 Vaccine 02/20/2020  8:22 AM 0.3 mL 11/26/2019 Intramuscular   Manufacturer: [employer], [employer]   Lot: [id number]   NDC: [address]-9

## 2020-10-24 ENCOUNTER — Ambulatory Visit: Payer: Medicare HMO | Admitting: Speech Pathology

## 2020-10-24 ENCOUNTER — Ambulatory Visit: Payer: Medicare HMO | Admitting: Occupational Therapy

## 2020-10-24 ENCOUNTER — Ambulatory Visit: Payer: Medicare HMO | Attending: Physical Medicine & Rehabilitation | Admitting: Physical Therapy

## 2020-10-24 DIAGNOSIS — M6281 Muscle weakness (generalized): Secondary | ICD-10-CM | POA: Insufficient documentation

## 2020-10-24 DIAGNOSIS — R208 Other disturbances of skin sensation: Secondary | ICD-10-CM | POA: Insufficient documentation

## 2020-10-24 DIAGNOSIS — I69152 Hemiplegia and hemiparesis following nontraumatic intracerebral hemorrhage affecting left dominant side: Secondary | ICD-10-CM | POA: Insufficient documentation

## 2020-10-24 DIAGNOSIS — R278 Other lack of coordination: Secondary | ICD-10-CM | POA: Insufficient documentation

## 2020-10-24 DIAGNOSIS — R270 Ataxia, unspecified: Secondary | ICD-10-CM | POA: Insufficient documentation

## 2020-10-24 DIAGNOSIS — R41840 Attention and concentration deficit: Secondary | ICD-10-CM | POA: Insufficient documentation

## 2020-10-25 ENCOUNTER — Other Ambulatory Visit: Payer: Self-pay

## 2020-10-25 ENCOUNTER — Ambulatory Visit: Payer: Medicare HMO | Admitting: Occupational Therapy

## 2020-10-25 ENCOUNTER — Encounter: Payer: Self-pay | Admitting: Occupational Therapy

## 2020-10-25 DIAGNOSIS — M6281 Muscle weakness (generalized): Secondary | ICD-10-CM | POA: Diagnosis present

## 2020-10-25 DIAGNOSIS — R278 Other lack of coordination: Secondary | ICD-10-CM | POA: Diagnosis present

## 2020-10-25 DIAGNOSIS — R41840 Attention and concentration deficit: Secondary | ICD-10-CM | POA: Diagnosis present

## 2020-10-25 DIAGNOSIS — R270 Ataxia, unspecified: Secondary | ICD-10-CM

## 2020-10-25 DIAGNOSIS — I69152 Hemiplegia and hemiparesis following nontraumatic intracerebral hemorrhage affecting left dominant side: Secondary | ICD-10-CM

## 2020-10-25 DIAGNOSIS — R208 Other disturbances of skin sensation: Secondary | ICD-10-CM | POA: Diagnosis present

## 2020-10-25 NOTE — Therapy (Signed)
[facility] [address] [place], [place], [address] Phone: [phone]   Fax:  [phone]  Occupational Therapy Evaluation  Patient Details  Name: Joseph [name] MRN: [id number] Date of Birth: 1951-02-25 Referring Provider (OT): Cephus Richer   Encounter Date: 10/25/2020   OT End of Session - 10/25/20 1838    Visit Number 1    Number of Visits 17    Date for OT Re-Evaluation 01/08/21    OT Start Time 1530    OT Stop Time 1615    OT Time Calculation (min) 45 min           Past Medical History:  Diagnosis Date  . Allergy   . Arthritis   . Depression   . Dyslipidemia   . ED (erectile dysfunction)   . Gout   . 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 Hypertension   . Stroke (HCC)   . Tuberculosis     Past Surgical History:  Procedure Laterality Date  . COLONOSCOPY  multiple  . COLONOSCOPY N/A 06/23/2014   Procedure: COLONOSCOPY;  Surgeon: Rachael Fee, MD;  Location: [facility] ENDOSCOPY;  Service: Endoscopy;  Laterality: N/A;  . COLONOSCOPY N/A 12/25/2014   Procedure: COLONOSCOPY;  Surgeon: Graylin Shiver, MD;  Location: [facility] ENDOSCOPY;  Service: Endoscopy;  Laterality: N/A;  . LAPAROSCOPIC SIGMOID COLECTOMY N/A 12/28/2014   Procedure: LAPAROSCOPIC SIGMOID COLECTOMY;  Surgeon: Karie Soda, MD;  Location: [facility] ORS;  Service: General;  Laterality: N/A;  . PROCTOSCOPY  12/28/2014   Procedure: PROCTOSCOPY;  Surgeon: Karie Soda, MD;  Location: [facility] ORS;  Service: General;;  . SMALL INTESTINE SURGERY      There were no vitals filed for this visit.   Subjective Assessment - 10/25/20 1535    Subjective  My biggest problem is my left arm    Currently in Pain? No/denies             [facility] OT Assessment - 10/25/20 0001      Assessment   Medical Diagnosis Intracranial hemorrhage    Referring Provider (OT) Cephus Richer    Onset Date/Surgical Date 06/15/20    Hand Dominance  Right    Prior Therapy IP Rehab at [facility]      Prior Function   Level of Independence Independent with basic ADLs    Vocation Full time employment    Vocation Requirements manual labor    Leisure garden, Presenter, broadcasting,       ADL   Eating/Feeding Minimal assistance    Grooming Modified independent    Upper Body Bathing Moderate assistance    Lower Body Bathing Moderate assistance    Upper Body Dressing Minimal assistance    Lower Body Dressing Modified independent    Toilet Transfer Modified independent    Toileting - Clothing Manipulation Modified independent    Toileting -  Hygiene Modified Independent    Tub/Shower Transfer Moderate assistance    Equipment Used --   grab bar, tub bench     IADL   Prior Level of Function Meal Prep Independent    Meal Prep Needs to have meals prepared and served    Prior Level of Function Best boy Relies on family or friends for transportation    Prior Level of Function Medication Managment independent    Medication Management Is not capable of dispensing or managing own medication    Prior Level  of Function Financial Management Independent    Financial Management Requires assistance      Vision - History   Baseline Vision Wears glasses only for reading    Additional Comments Reports no changes in vision      Vision Assessment   Eye Alignment Within Functional Limits    Ocular Range of Motion Within Functional Limits      Activity Tolerance   Activity Tolerance Comments Patient reports being very active prior to hemorrhage      Cognition   Overall Cognitive Status Impaired/Different from baseline    Area of Impairment Attention;Safety/judgement;Awareness    Current Attention Level Sustained    Safety/Judgement Decreased awareness of deficits    Awareness Emergent      Posture/Postural Control   Posture/Postural Control No significant limitations   In sitting     Sensation   Light Touch  Impaired by gross assessment    Stereognosis Impaired by gross assessment    Hot/Cold Appears Intact    Proprioception Impaired by gross assessment      Coordination   Gross Motor Movements are Fluid and Coordinated No    Fine Motor Movements are Fluid and Coordinated No    Coordination and Movement Description ataxic movement left    9 Hole Peg Test Right;Left    Right 9 Hole Peg Test 27.44    Left 9 Hole Peg Test 1.08.59      Perception   Perception Within Functional Limits      Praxis   Praxis Intact      Tone   Assessment Location Left Upper Extremity      ROM / Strength   AROM / PROM / Strength AROM;Strength      AROM   Overall AROM  Within functional limits for tasks performed    AROM Assessment Site Shoulder;Elbow;Forearm      Strength   Overall Strength Deficits    Overall Strength Comments Not tested LUE - Impaired tonus, RUE 4/5 throughout      Hand Function   Right Hand Gross Grasp Impaired    Right Hand Grip (lbs) 60    Right Hand Lateral Pinch 21 lbs    Left Hand Gross Grasp Impaired    Left Hand Grip (lbs) 40    Left Hand Lateral Pinch 14 lbs      LUE Tone   LUE Tone Mild;Hypertonic                           OT Education - 10/25/20 1605    Education Details Reviewed OT eval results and potential goals for therapy    Person(s) Educated Patient    Methods Explanation    Comprehension Verbalized understanding            OT Short Term Goals - 10/25/20 1847      OT SHORT TERM GOAL #1   Title Patient will complete HEP designed to improve coordiantion in LUE    Baseline No HEP    Time 4    Period Weeks    Status New    Target Date 12/09/20      OT SHORT TERM GOAL #2   Title Patient will demonstrate 5lb increase in left grip strength    Baseline 40    Time 4    Period Weeks    Status New      OT SHORT TERM GOAL #3   Title Patient will demonstrate 10 sec  improvement in 9 hole peg test to aide with fine motor coord task  such as buttoning shirts    Baseline over 1 min    Time 4    Period Weeks    Status New      OT SHORT TERM GOAL #4   Title Patient will bathe self with no more than min assistance    Baseline family is bathing him    Time 4    Period Weeks    Status New             OT Long Term Goals - 10/25/20 1849      OT LONG TERM GOAL #1   Title Patient wil complete a home exercise program designed to improve functional use of LUE    Baseline No HEP    Time 8    Period Weeks    Status New    Target Date 01/08/21      OT LONG TERM GOAL #2   Title Patient will demonstrate an 8 lb increase in grip strength with left hand to aide in opening packages, carrying heavy items    Baseline 40 lb    Time 8    Period Weeks    Status New      OT LONG TERM GOAL #3   Title Patient will demonstrate a 15 sec improvement in 9 hole peg test    Baseline over 1 min    Time 8    Period Weeks    Status New      OT LONG TERM GOAL #4   Title Patient will demonstrate ability to use two hands to cut food on plate, or effectively use compensatory strategy    Baseline Cannot cut food    Time 8    Period Weeks    Status New      OT LONG TERM GOAL #5   Title Patient will walk and with his left hand, carry a glass 2/3 full of liquid across 20 foot crowded room    Baseline does not carry with left hand    Time 4    Period Weeks    Status New                 Plan - 10/25/20 1838    Clinical Impression Statement Patient is a 69 year old man admitted to OP OT to address deficits related to recent intracranial hemorrhage (07/21)  Patient received IP Rehab at [facility], and has received some home health therapy as well.  Patient presents to OT eval with ataxic movement in Left limbs UE>LE,  decreased strength in BUE's, decreased balance, decreased awareness of deficits, and decreased coordination which impede his independence with ADL/IADL, and work activities.    OT Occupational Profile and  History Detailed Assessment- Review of Records and additional review of physical, cognitive, psychosocial history related to current functional performance    Occupational performance deficits (Please refer to evaluation for details): ADL's;IADL's;Work;Leisure    Body Structure / Function / Physical Skills ADL;Coordination;Endurance;GMC;UE functional use;Vestibular;Decreased knowledge of precautions;Balance;Body mechanics;Decreased knowledge of use of DME;Flexibility;IADL;Pain;Strength;Proprioception;FMC;Dexterity;Mobility;ROM;Tone    Cognitive Skills Attention;Emotional;Memory;Problem Solve;Safety Awareness    Rehab Potential Good    Clinical Decision Making Several treatment options, min-mod task modification necessary    Comorbidities Affecting Occupational Performance: May have comorbidities impacting occupational performance    Modification or Assistance to Complete Evaluation  Min-Moderate modification of tasks or assist with assess necessary to complete eval    OT Frequency  2x / week    OT Duration 8 weeks    OT Treatment/Interventions Self-care/ADL training;Therapeutic exercise;Patient/family education;Splinting;Aquatic Therapy;Functional Mobility Training;Neuromuscular education;Therapeutic activities;Balance training;Cognitive remediation/compensation;Manual Therapy;DME and/or AE instruction    Plan Check Coordination exercises - add to HEP - Shoulder, elbow, forearm, wrist, hand    OT Home Exercise Plan issued coordination exercises    Consulted and Agree with Plan of Care Patient           Patient will benefit from skilled therapeutic intervention in order to improve the following deficits and impairments:   Body Structure / Function / Physical Skills: ADL, Coordination, Endurance, GMC, UE functional use, Vestibular, Decreased knowledge of precautions, Balance, Body mechanics, Decreased knowledge of use of DME, Flexibility, IADL, Pain, Strength, Proprioception, FMC, Dexterity,  Mobility, ROM, Tone Cognitive Skills: Attention, Emotional, Memory, Problem Solve, Safety Awareness     Visit Diagnosis: Ataxia - Plan: Ot plan of care cert/re-cert  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 Pre-diabetes 10/19/2014  . History of stroke 06/22/2013  . Hypertension 10/26/2012  . Hyperlipidemia 10/26/2012  . Gout 10/26/2012    Collier Salina, OTR/L 10/25/2020, 6:55 PM  [facility] [address] [place], [place], [address] Phone: [phone]   Fax:  [phone]  Name: Joseph [name] MRN: [id number] Date of Birth: 1951-05-11

## 2020-10-25 NOTE — Patient Instructions (Signed)
°  Coordination Activities  Perform the following activities for 5-10 minutes 1-2  times per day with left hand(s).   Flip cards 1 at a time as fast as you can.  Deal cards with your thumb (Hold deck in hand and push card off top with thumb).  Rotate card in hand (clockwise and counter-clockwise).  Shuffle cards.  Pick up coins and stack.  Pick up coins one at a time until you get 5-10 in your hand, then move coins from palm to fingertips to stack one at a time.
